# Patient Record
Sex: Female | Born: 1980 | Race: White | Hispanic: No | Marital: Married | Smoking: Former smoker
Health system: Southern US, Community
[De-identification: ages and names within clinical notes are randomized; demographics above are authoritative.]

## PROBLEM LIST (undated history)

## (undated) DIAGNOSIS — G43909 Migraine, unspecified, not intractable, without status migrainosus: Secondary | ICD-10-CM

## (undated) DIAGNOSIS — Z905 Acquired absence of kidney: Secondary | ICD-10-CM

## (undated) DIAGNOSIS — K219 Gastro-esophageal reflux disease without esophagitis: Secondary | ICD-10-CM

## (undated) DIAGNOSIS — E669 Obesity, unspecified: Secondary | ICD-10-CM

## (undated) DIAGNOSIS — N281 Cyst of kidney, acquired: Secondary | ICD-10-CM

## (undated) DIAGNOSIS — F909 Attention-deficit hyperactivity disorder, unspecified type: Secondary | ICD-10-CM

## (undated) DIAGNOSIS — R011 Cardiac murmur, unspecified: Secondary | ICD-10-CM

## (undated) HISTORY — PX: OTHER SURGICAL HISTORY: SHX169

## (undated) HISTORY — DX: Attention-deficit hyperactivity disorder, unspecified type: F90.9

## (undated) HISTORY — DX: Acquired absence of kidney: Z90.5

## (undated) HISTORY — DX: Obesity, unspecified: E66.9

## (undated) HISTORY — DX: Migraine, unspecified, not intractable, without status migrainosus: G43.909

## (undated) HISTORY — DX: Gastro-esophageal reflux disease without esophagitis: K21.9

## (undated) HISTORY — DX: Cyst of kidney, acquired: N28.1

## (undated) HISTORY — DX: Cardiac murmur, unspecified: R01.1

## (undated) HISTORY — PX: CHOLECYSTECTOMY: SHX55

## (undated) HISTORY — PX: TOTAL ABDOMINAL HYSTERECTOMY: SHX209

---

## 2002-10-29 ENCOUNTER — Emergency Department (HOSPITAL_COMMUNITY): Admission: EM | Admit: 2002-10-29 | Discharge: 2002-10-29 | Payer: Self-pay | Admitting: Emergency Medicine

## 2004-02-04 ENCOUNTER — Other Ambulatory Visit: Admission: RE | Admit: 2004-02-04 | Discharge: 2004-02-04 | Payer: Self-pay | Admitting: Obstetrics and Gynecology

## 2004-02-14 ENCOUNTER — Other Ambulatory Visit: Admission: RE | Admit: 2004-02-14 | Discharge: 2004-02-14 | Payer: Self-pay | Admitting: Obstetrics and Gynecology

## 2004-03-19 ENCOUNTER — Ambulatory Visit (HOSPITAL_COMMUNITY): Admission: RE | Admit: 2004-03-19 | Discharge: 2004-03-19 | Payer: Self-pay | Admitting: Obstetrics and Gynecology

## 2004-12-04 ENCOUNTER — Ambulatory Visit (HOSPITAL_COMMUNITY): Admission: RE | Admit: 2004-12-04 | Discharge: 2004-12-04 | Payer: Self-pay | Admitting: Pulmonary Disease

## 2005-04-28 ENCOUNTER — Other Ambulatory Visit: Admission: RE | Admit: 2005-04-28 | Discharge: 2005-04-28 | Payer: Self-pay | Admitting: Obstetrics and Gynecology

## 2005-08-26 ENCOUNTER — Encounter (HOSPITAL_COMMUNITY): Admission: RE | Admit: 2005-08-26 | Discharge: 2005-08-26 | Payer: Self-pay | Admitting: Internal Medicine

## 2006-02-18 IMAGING — NM NM HEPATO W/GB/PHARM/[PERSON_NAME]
2 series · 12 of 12 positions shown · non-contrast
Comparison: Ultrasound dated 12/04/04.

CLINICAL DATA: Abdominal pain. 
 NUCLEAR MEDICINE HEPATOBILIARY SCAN WITH EJECTION FRACTION:
TECHNIQUE: Sequential abdominal images were obtained following intravenous injection of radiopharmaceutical.  Sequential images were continued following oral ingestion of 8 oz. half-and-half, and the gallbladder ejection fraction was calculated.
 Radiopharmaceutical:  4 mCi Sc-XXm Choletec.

[Series 1: hepatobiliary · 3.20mm/px · 6 of 60 frames shown (1 of 2)]
[frame 6/60]
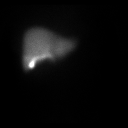
[frame 16/60]
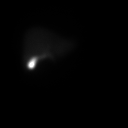
[frame 26/60]
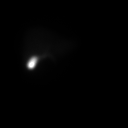
[frame 36/60]
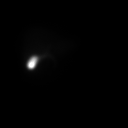
[frame 46/60]
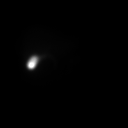
[frame 56/60]
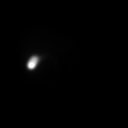

[Series 1: hepatobiliary · 3.20mm/px · 6 of 60 frames shown (2 of 2)]
[frame 6/60]
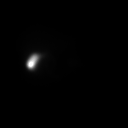
[frame 16/60]
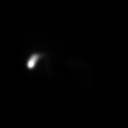
[frame 26/60]
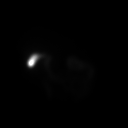
[frame 36/60]
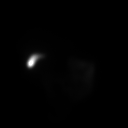
[frame 46/60]
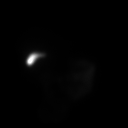
[frame 56/60]
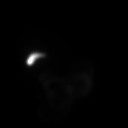

[12 of 12 positions shown; findings below may reference images not displayed]

FINDINGS: Following the intravenous administration of the radiopharmaceutical, there is prompt radiotracer uptake by the liver with excretion into the gallbladder.  After 60 minutes, the patient was given 8 oz of half-and-half.  Sequential images were obtained, 1 minute per frame.  Gallbladder ejection fraction is equal to 34%.
IMPRESSION: 1.  Patent cystic duct without evidence for acute cholecystitis. 
 2.  Gallbladder ejection fraction equals 34%.  When gallbladder stimulation is provided via oral half-and-half, 34% is considered abnormally low.

## 2006-02-24 ENCOUNTER — Ambulatory Visit (HOSPITAL_COMMUNITY): Admission: RE | Admit: 2006-02-24 | Discharge: 2006-02-24 | Payer: Self-pay | Admitting: Internal Medicine

## 2006-05-25 ENCOUNTER — Other Ambulatory Visit: Admission: RE | Admit: 2006-05-25 | Discharge: 2006-05-25 | Payer: Self-pay | Admitting: Obstetrics and Gynecology

## 2007-07-05 ENCOUNTER — Inpatient Hospital Stay (HOSPITAL_COMMUNITY): Admission: AD | Admit: 2007-07-05 | Discharge: 2007-07-05 | Payer: Self-pay | Admitting: Obstetrics and Gynecology

## 2007-07-23 ENCOUNTER — Inpatient Hospital Stay (HOSPITAL_COMMUNITY): Admission: AD | Admit: 2007-07-23 | Discharge: 2007-07-27 | Payer: Self-pay | Admitting: Obstetrics and Gynecology

## 2007-12-07 ENCOUNTER — Ambulatory Visit (HOSPITAL_COMMUNITY): Admission: RE | Admit: 2007-12-07 | Discharge: 2007-12-07 | Payer: Self-pay | Admitting: General Surgery

## 2007-12-07 ENCOUNTER — Encounter (INDEPENDENT_AMBULATORY_CARE_PROVIDER_SITE_OTHER): Payer: Self-pay | Admitting: General Surgery

## 2008-01-18 ENCOUNTER — Ambulatory Visit (HOSPITAL_COMMUNITY): Payer: Self-pay | Admitting: Endocrinology

## 2008-01-18 ENCOUNTER — Encounter (HOSPITAL_COMMUNITY): Admission: RE | Admit: 2008-01-18 | Discharge: 2008-02-17 | Payer: Self-pay | Admitting: Endocrinology

## 2009-05-20 ENCOUNTER — Encounter: Payer: Self-pay | Admitting: Orthopedic Surgery

## 2009-05-21 ENCOUNTER — Ambulatory Visit: Payer: Self-pay | Admitting: Orthopedic Surgery

## 2009-05-21 DIAGNOSIS — S139XXA Sprain of joints and ligaments of unspecified parts of neck, initial encounter: Secondary | ICD-10-CM | POA: Insufficient documentation

## 2009-05-21 DIAGNOSIS — M5412 Radiculopathy, cervical region: Secondary | ICD-10-CM | POA: Insufficient documentation

## 2009-05-21 DIAGNOSIS — G5600 Carpal tunnel syndrome, unspecified upper limb: Secondary | ICD-10-CM | POA: Insufficient documentation

## 2009-07-02 ENCOUNTER — Ambulatory Visit: Payer: Self-pay | Admitting: Orthopedic Surgery

## 2010-01-16 ENCOUNTER — Ambulatory Visit (HOSPITAL_COMMUNITY): Admission: RE | Admit: 2010-01-16 | Discharge: 2010-01-16 | Payer: Self-pay | Admitting: Internal Medicine

## 2010-07-11 IMAGING — US US RENAL
1 series · 14 of 14 positions shown · non-contrast
Comparison: 12/04/2004.

CLINICAL DATA: Hematuria.  Left nephrectomy.

RENAL/URINARY TRACT ULTRASOUND COMPLETE

[Series 1: us renal · 0.26mm/px · 14 of 14 slices shown]
[im 1/14]
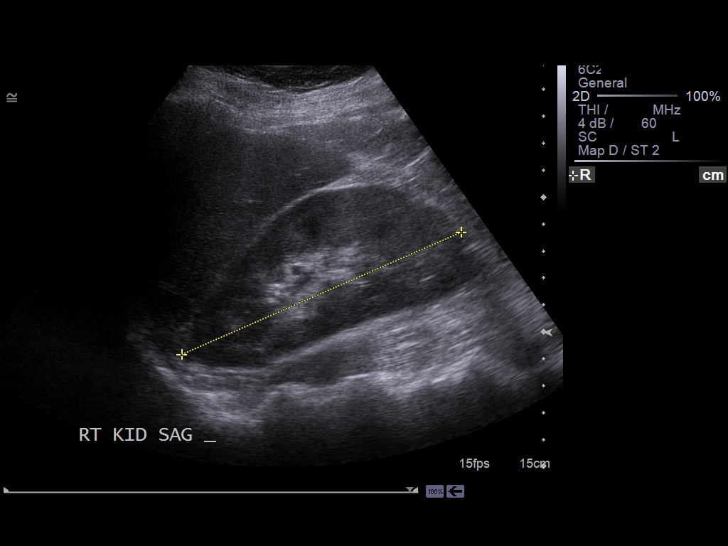
[im 2/14]
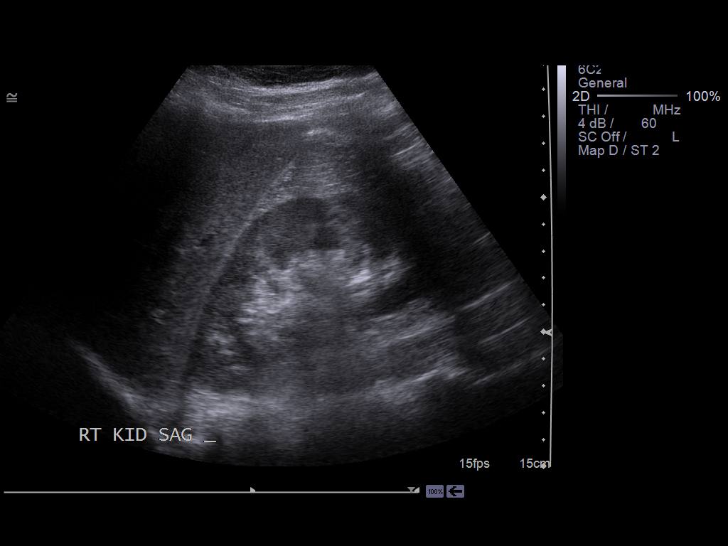
[im 3/14]
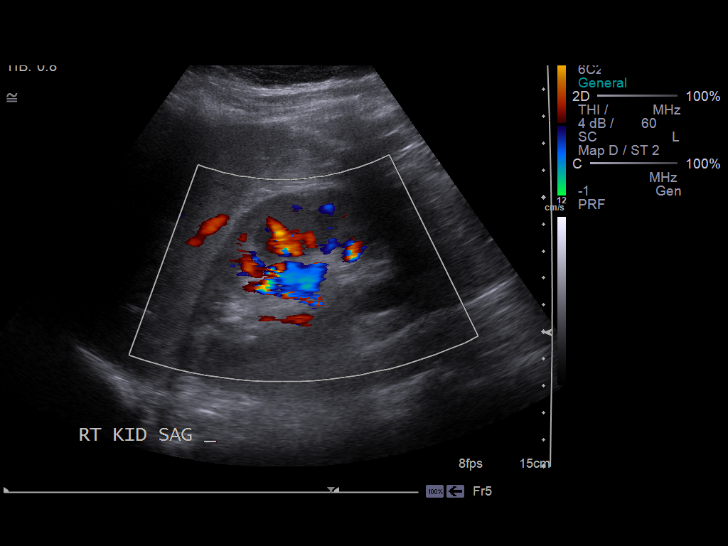
[im 4/14]
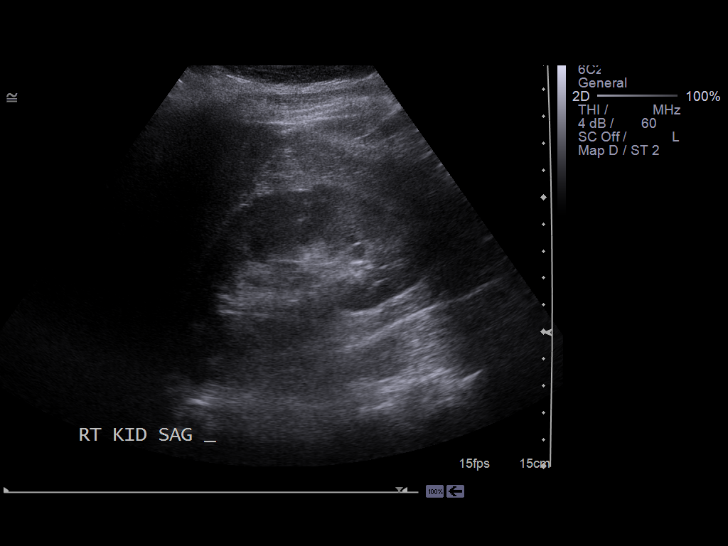
[im 5/14]
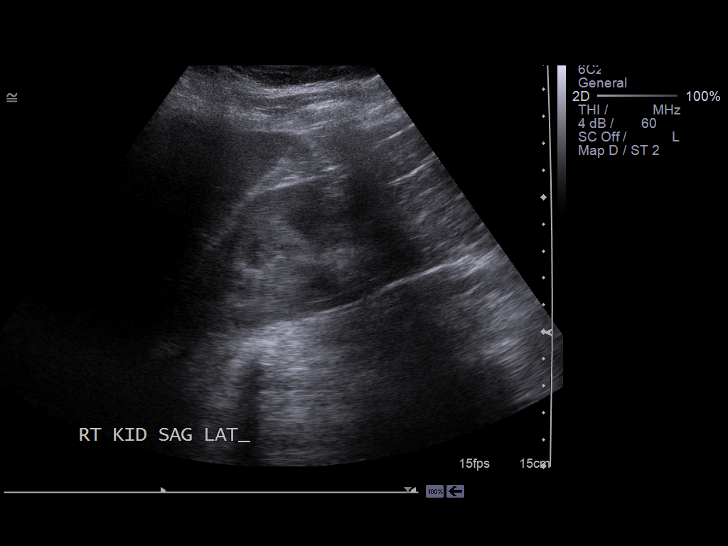
[im 6/14]
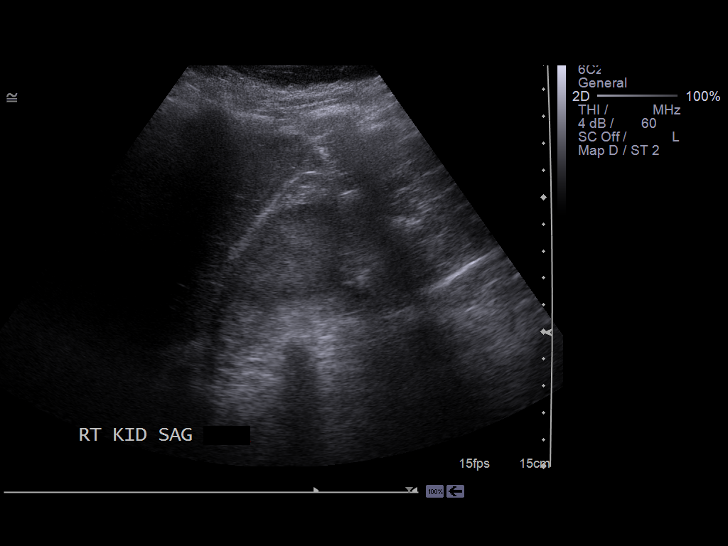
[im 7/14]
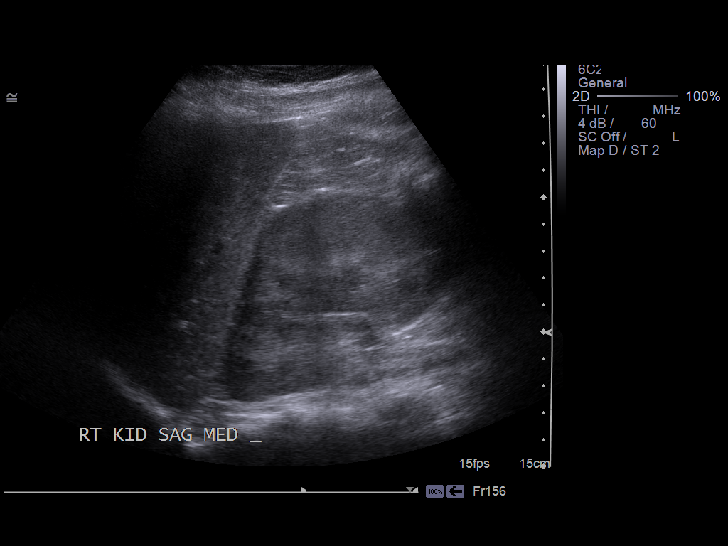
[im 8/14]
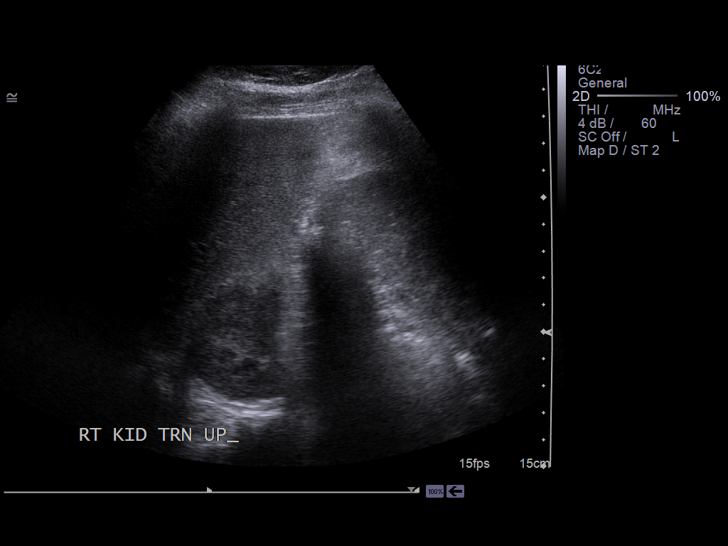
[im 9/14]
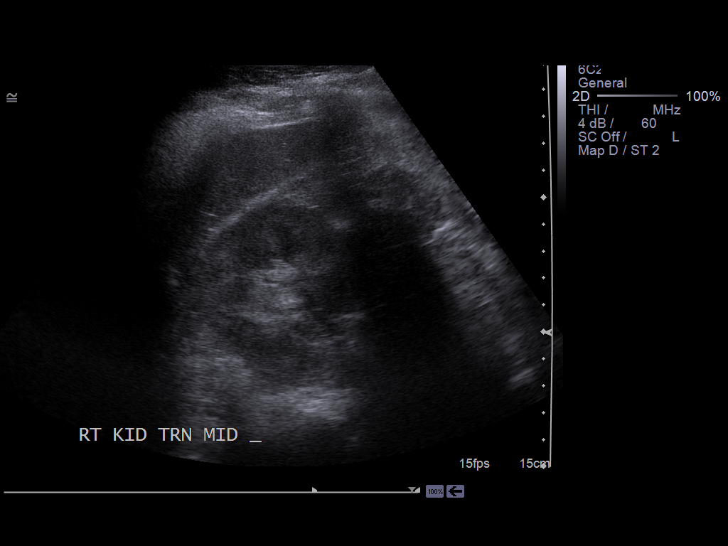
[im 10/14]
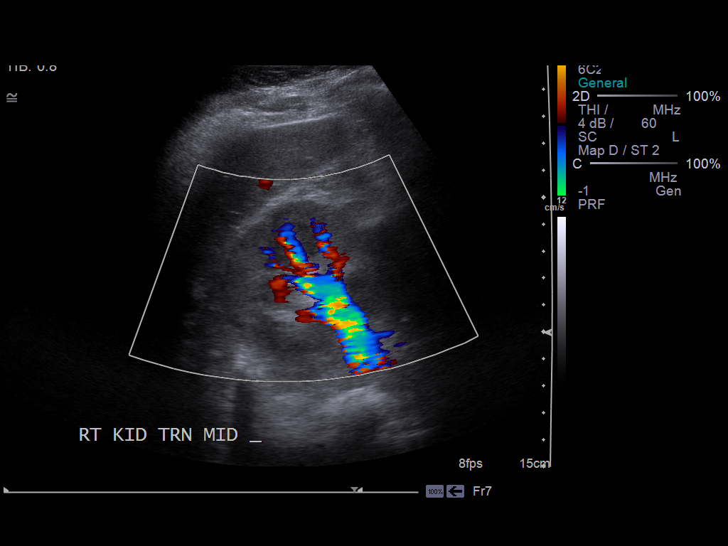
[im 11/14]
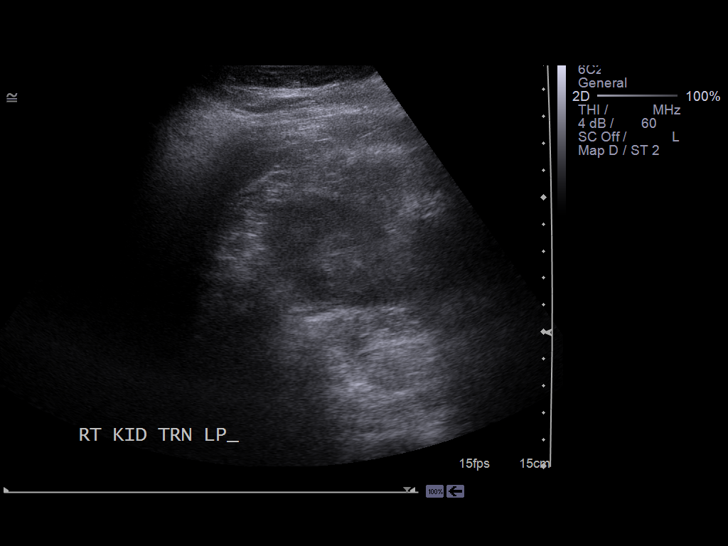
[im 12/14]
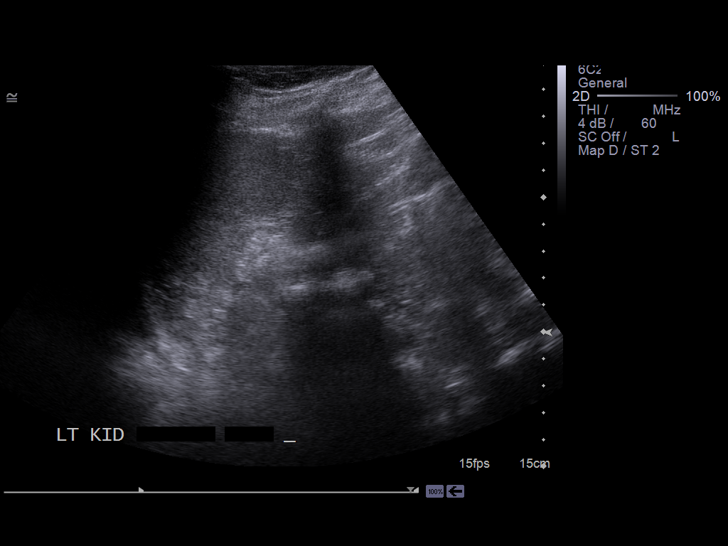
[im 13/14]
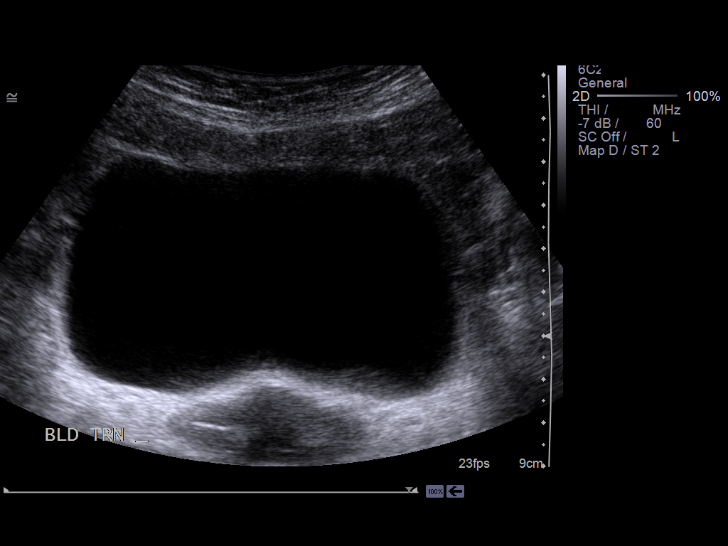
[im 14/14]
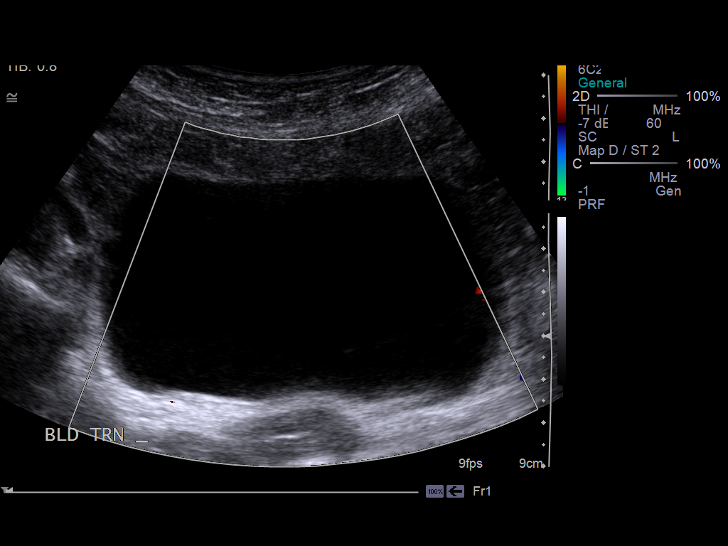

[14 of 14 positions shown; findings below may reference images not displayed]

FINDINGS: Right Kidney:  11.3 cm in length.  Normal renal cortex.  No
obstruction or mass.  No renal calculi.

Left Kidney:  Surgically absent due to kidney donation.

Bladder:  Normal
IMPRESSION: Normal right kidney and bladder.

## 2010-09-21 ENCOUNTER — Encounter: Payer: Self-pay | Admitting: Pulmonary Disease

## 2011-01-13 NOTE — H&P (Signed)
NAME:  Kaylee Contreras, Kaylee Contreras           ACCOUNT NO.:  [id number]   MEDICAL RECORD NO.:  [id number]          PATIENT TYPE:  AMB   LOCATION:  DAY                           FACILITY:  [facility]   PHYSICIAN:  Tilford Pillar, MD      DATE OF BIRTH:  April 25, 1981   DATE OF ADMISSION:  DATE OF DISCHARGE:  LH                              HISTORY & PHYSICAL   CHIEF COMPLAINT:  Infected nodule under left axilla.   HISTORY OF PRESENT ILLNESS:  The patient is a 30 year old female who  presented to my office after referral from her primary care physician  with multiple episodes of inflammation under her left axilla.  This  started soon after the birth of her child back in November with the  first episode in December 2008.  She has been treated multiple times  antibiotics mainly on Bactrim double-strength with response.  This is  followed by a return of the inflammation and erythema following  completion of her antibiotic course.  She has never had any drainage.  No fevers or chills.  No other nodules.  She has not had any problems in  the right side.  She has not noticed any breast changes, no breast  masses, no nipple discharge.  She has no family history of any  hidradenitis or recurrent infections.   PAST MEDICAL HISTORY:  None.   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  She is postoperative for a laparoscopic  cholecystectomy.   MEDICATIONS:  Currently on Bactrim double-strength and on Chantix.   ALLERGIES:  She is allergic IV DYES and develops hives.   SOCIAL HISTORY:  She smokes about one pack over a 4-day period.  Rarely  uses alcohol, no recreational drugs.  Occupation:  She works as a Psychologist, forensic.   PREGNANCIES:  One in November 2008.   REVIEW OF SYSTEMS:  CONSTITUTIONAL:  Unremarkable.  EYES:  Unremarkable.  EARS, NOSE AND THROAT:  Occasional rhinorrhea with seasonal allergies.  RESPIRATORY:  Unremarkable.  CARDIOVASCULAR:  Unremarkable.  GASTROINTESTINAL:  Occasional heartburn.  GENITOURINARY:  Unremarkable.  MUSCULOSKELETAL:  Does have some lower back pain.  SKIN:  Unremarkable  other than HPI.  ENDOCRINE: decreased energy.  NEURO:  Unremarkable.   PHYSICAL EXAMINATION:  The patient is a healthy, calm-appearing female  in no acute distress.  She is alert and oriented x3.  HEENT:  Scalp:  No deformities, no masses.  Eyes:  Pupils equal, round,  reactive.  Extraocular movements are intact.  No conjunctival pallor is  noted.  Ears:  No diminished hearing on evaluation.  Oral mucosa is  pink, normal occlusion.  NECK:  Trachea is midline.  No thyroid nodules or goiter is appreciated.  No cervical lymphadenopathy is apparent.  PULMONARY:  Unlabored respirations.  No wheezes, no crackles.  She is  clear to auscultation in bilateral lung fields.  CARDIOVASCULAR:  Regular rate and rhythm.  No murmurs, no gallops.  She has 2+ radial  pulses bilaterally.  No edema in the upper extremities.  CHEST WALL:  No deformities, no masses  or nodules are noted.  She does  have a firm area under the left axilla which is the area suspicion from  her last inflammatory response.  This is mobile, nontender,  nonerythematous, no drainage noted.  This, again, is in the left axilla  above the mid axillary line.  Again, nothing is appreciated on the right  side.  SKIN:  Warm and dry.   ASSESSMENT AND PLAN:  Left axillary cyst.  At this point my suspicion is  either a recurrent sebaceous cyst infection versus early development of  hidradenitis on this side, although I do not see any sinus tract.  This  was discussed with the patient.  At that this point both conservative as  well as operative interventions were discussed with the patient with  conservative treatment continuing with antibiotics and close follow-up  and observation.  As the patient has had several episodes of recurrent  infections with this, she does wish to have this are excised.   Risks,  benefits and alternatives of excision of this nodule were discussed with  the patient including the possibility of recurrence.  At this point I do  have a low suspicion of any cancerous process with no significant family  history or personal history consistent with breast cancer.  This would  be a very low suspicion at this point.  This was discussed with the  patient and at this point will plan to proceed with the operation and  will follow up in the postoperative period.      Tilford Pillar, MD     BZ/MEDQ  D:  12/01/2007  T:  12/02/2007  Job:  [id number]   cc:   Patrica Duel, M.D.  Fax: [phone]

## 2011-01-13 NOTE — Discharge Summary (Signed)
NAME:  Kaylee Contreras, Kaylee Contreras           ACCOUNT NO.:  [id number]   MEDICAL RECORD NO.:  [id number]          PATIENT TYPE:  INP   LOCATION:  9112                          FACILITY:  [facility]   PHYSICIAN:  Huel Cote, M.D. DATE OF BIRTH:  [birth year]   DATE OF ADMISSION:  07/23/2007  DATE OF DISCHARGE:  07/27/2007                               DISCHARGE SUMMARY   DISCHARGE DIAGNOSES:  1. Term pregnancy at 39-2/7 weeks, delivered.  2. Status post primary low transverse cesarean section for arrest of      dilation.   DISCHARGE MEDICATIONS:  1. Percocet 5/325 1-2 tablets p.o. every 4 hours p.r.n.  2. Diflucan 200 mg p.o. daily x7 days.   DISCHARGE FOLLOWUP:  Patient is to follow up in my office in two weeks  for an incision check.   HOSPITAL COURSE:  Patient is a 30 year old G1, P0, who is admitted at 62-  2/[redacted] weeks gestation with complaint of painful contractions every 5-10  minutes.  Cervix on admission was 84 at -1 station with bulging bag.  Prenatal care had been complicated by a history of depression.  She was  a smoker but had decreased to 1-2 cigarettes a day.  Prenatal labs were  as follows:  A+, antibody negative, RPR nonreactive, Rubella immune,  hepatitis C surface antigen negative, HIV negative, GC negative,  Chlamydia negative, Group B strep negative.  One hour Glucola 118.   PAST OB HISTORY:  None.   PAST GYN HISTORY:  None.   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  Her  other one had been donated to her father.  She had migraines and  depression in the past; however, was not currently having any issues  with this and was on no medications.   PAST SURGICAL HISTORY:  Cholecystectomy in 2007 and her nephrectomy  donation.   ALLERGIES:  She does not tolerate NONSTEROIDALS, which are not given due  to her status of a single kidney, and IV CONTRAST.   On admission, she was afebrile with stable vital signs.  Fetal heart  rates were reactive.   Contractions were every 5-8 minutes.  She was 84  and -1 station.  She received an epidural and had rupture of membranes  performed thereafter.  She was noted to be 95 and a -1 station.  She had  an IUPC placed to help with adjusting her Pitocin.  She labored  throughout the night, reached approximately 9 cm and remained 9 cm for  greater than three hours.  We tried multiple adjustments of Pitocin and  position change and really could not get the patient to complete  dilation.  The baby felt to be OP, and it was felt that she had  cephalopelvic disproportion.  We discussed the need to proceed with C-  section, to which she was agreeable.  She underwent a primary low  transverse C-section with double layer closure of the uterus and was  delivered of a viable female infant.  Apgars are 8 and 9.  Weight was 7  pounds, 12 ounces.  She had normal uterus, tubes,  and ovaries noted at  the time of section.  She did well post partum.   On postop day #1, her hemoglobin was 8.4, and this was down from 10.1.  She did have a temperature post partum to 101.4 and for this was given  several doses of Unasyn 3 gm IV every 6 hours to treat any possible  lingering endometritis.  She responded well to this and was afebrile  within 24 hours of delivery.   By postop day #2, she was doing well.  She did have a migraine, for  which she received Imitrex 50 mg x1 and that resolved.   On postop day #3, she was tolerating a regular diet.  All was well.  She  was felt stable for discharge home.  She had her staples removed, and  Steri-Strips placed.  She was placed on Percocet discharge medications  for pain since she is not able to take nonsteroidals.  She was given  Diflucan 200 mg p.o. 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      Huel Cote, M.D.  Electronically Signed     KR/MEDQ  D:  07/27/2007  T:  07/27/2007  Job:  [id number]

## 2011-01-13 NOTE — Op Note (Signed)
NAME:  Kaylee Contreras, Kaylee Contreras           ACCOUNT NO.:  [id number]   MEDICAL RECORD NO.:  [id number]          PATIENT TYPE:  AMB   LOCATION:  DAY                           FACILITY:  [facility]   PHYSICIAN:  Tilford Pillar, MD      DATE OF BIRTH:  March 24, 1981   DATE OF PROCEDURE:  12/07/2007  DATE OF DISCHARGE:                               OPERATIVE REPORT   PRIMARY CARE PHYSICIAN:  Patrica Duel, M.D.   PREOPERATIVE DIAGNOSIS:  Left axillary nodule.   POSTOPERATIVE DIAGNOSIS:  Left axillary nodule.   PROCEDURE:  Excision of left axillary nodule, approximately 2-cm  incision.   SURGEON:  Tilford Pillar, MD   ANESTHESIA:  General laryngeal mask airway.   BLOOD LOSS:  Minimal.   SPECIMEN:  Left axillary tissue.   INDICATIONS:  The patient is a 30 year old female who presented to my  office with a history of a nodule in the left axilla.  This had been  increased in size, became erythematous, and was exquisitely painful  until given the antibiotic treatment.  This had happened on multiple  occasions; and based on this discussion, I suspected this was likely a  small sebaceous cyst versus an early start hidradenitis.  The risks,  benefits, and alternatives of excision versus continued conservative  management were discussed with the patient.  Risks including but not  limited to infection, bleeding, and recurrence were discussed with the  patient.  The patient's questions and concerns were addressed.  The  patient was consented for the planned procedure.   OPERATION:  The patient was taken to the operating room.  She was placed  in the supine position on the operating table at which time general  anesthetic was administered.  Once the patient was asleep, she had a  laryngeal mask airway placed by anesthesia.  At this point, her left  axilla was prepped with Betadine solution.  Sterile drapes were placed  in a standard fashion.  A skin incision was created with a scalpel over  the palpable  nodule.  Additional dissection down through the  subcuticular tissue was carried out using electrocautery.  Allis clamp  was utilized to grasp the nodule and lift this anteriorly out of the  wound.  Dissection was continued with electrocautery until the mass was  freed circumferentially.  This was placed on the back table and sent as  permanent specimen to pathology.  At this point, the wound was palpated.  There were no additional nodules or masses or thickened tissue  identified on palpation.  During this dissection, a small Bovie burn was  created in the adjacent axillary skin.  This was excised with a scalpel  in an elliptical fashion and was reapproximated with a 4-0 Monocryl in a  subcuticular suture.  The wound was irrigated.  Local anesthetic was  instilled.  Hemostasis was excellent.  At this time, a 3-0 Vicryl was  utilized to reapproximate deep subcuticular tissue and 4-0 Monocryl was  utilized to reapproximate the skin edges in a running subcuticular  suture.  The skin was washed and dried with a moist and dry towel.  Benzoin was applied around the incision.  Half inch Steri-Strips were  placed.  Drapes were removed.  The patient was allowed to come out of  the general anesthetic and was transferred back to regular hospital bed.  She was transferred to the postanesthetic care unit in a stable  condition.  At the conclusion of the procedure, all instrument, sponge,  and needle counts were correct.  The patient tolerated the procedure  well.      Tilford Pillar, MD  Electronically Signed     BZ/MEDQ  D:  12/07/2007  T:  12/08/2007  Job:  [id number]   cc:   Patrica Duel, M.D.  Fax: [phone]

## 2011-01-13 NOTE — H&P (Signed)
NAME:  Kaylee Contreras, Kaylee Contreras           ACCOUNT NO.:  [id number]   MEDICAL RECORD NO.:  [id number]          PATIENT TYPE:  AMB   LOCATION:  DAY                           FACILITY:  [facility]   PHYSICIAN:  Tilford Pillar, MD      DATE OF BIRTH:  06-Nov-1980   DATE OF ADMISSION:  DATE OF DISCHARGE:  LH                              HISTORY & PHYSICAL   CHIEF COMPLAINT:  Infected nodule under left axilla.   HISTORY OF PRESENT ILLNESS:  The patient is a 30 year old female who  presented to my office after referral from her primary care physician  with multiple episodes of inflammation under her left axilla.  This  started soon after the birth of her child back in November with the  first episode in December 2008.  She has been treated multiple times  antibiotics mainly on Bactrim double-strength with response.  This is  followed by a return of the inflammation and erythema following  completion of her antibiotic course.  She has never had any drainage.  No fevers or chills.  No other nodules.  She has not had any problems in  the right side.  She has not noticed any breast changes, no breast  masses, no nipple discharge.  She has no family history of any  hidradenitis or recurrent infections.   PAST MEDICAL HISTORY:  None.   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  She is postoperative for a laparoscopic  cholecystectomy.   MEDICATIONS:  Currently on Bactrim double-strength and on Chantix.   ALLERGIES:  She is allergic IV DYES and develops hives.   SOCIAL HISTORY:  She smokes about one pack over a 4-day period.  Rarely  uses alcohol, no recreational drugs.  Occupation:  She works as a Psychologist, forensic.   PREGNANCIES:  One in November 2008.   REVIEW OF SYSTEMS:  CONSTITUTIONAL:  Unremarkable.  EYES:  Unremarkable.  EARS, NOSE AND THROAT:  Occasional rhinorrhea with seasonal allergies.  RESPIRATORY:  Unremarkable.  CARDIOVASCULAR:  Unremarkable.  GASTROINTESTINAL:  Occasional heartburn.  GENITOURINARY:  Unremarkable.  MUSCULOSKELETAL:  Does have some lower back pain.  SKIN:  Unremarkable  other than HPI.  ENDOCRINE: decreased energy.  NEURO:  Unremarkable.   PHYSICAL EXAMINATION:  The patient is a healthy, calm-appearing female  in no acute distress.  She is alert and oriented x3.  HEENT:  Scalp:  No deformities, no masses.  Eyes:  Pupils equal, round,  reactive.  Extraocular movements are intact.  No conjunctival pallor is  noted.  Ears:  No diminished hearing on evaluation.  Oral mucosa is  pink, normal occlusion.  NECK:  Trachea is midline.  No thyroid nodules or goiter is appreciated.  No cervical lymphadenopathy is apparent.  PULMONARY:  Unlabored respirations.  No wheezes, no crackles.  She is  clear to auscultation in bilateral lung fields.  CARDIOVASCULAR:  Regular rate and rhythm.  No murmurs, no gallops.  She has 2+ radial  pulses bilaterally.  No edema in the upper extremities.  CHEST WALL:  No deformities, no masses  or nodules are noted.  She does  have a firm area under the left axilla which is the area suspicion from  her last inflammatory response.  This is mobile, nontender,  nonerythematous, no drainage noted.  This, again, is in the left axilla  above the mid axillary line.  Again, nothing is appreciated on the right  side.  SKIN:  Warm and dry.   ASSESSMENT AND PLAN:  Left axillary cyst.  At this point my suspicion is  either a recurrent sebaceous cyst infection versus early development of  hidradenitis on this side, although I do not see any sinus tract.  This  was discussed with the patient.  At that this point both conservative as  well as operative interventions were discussed with the patient with  conservative treatment continuing with antibiotics and close follow-up  and observation.  As the patient has had several episodes of recurrent  infections with this, she does wish to have this are excised.   Risks,  benefits and alternatives of excision of this nodule were discussed with  the patient including the possibility of recurrence.  At this point I do  have a low suspicion of any cancerous process with no significant family  history or personal history consistent with breast cancer.  This would  be a very low suspicion at this point.  This was discussed with the  patient and at this point will plan to proceed with the operation and  will follow up in the postoperative period.      Tilford Pillar, MD  Electronically Signed     BZ/MEDQ  D:  12/01/2007  T:  12/02/2007  Job:  [id number]   cc:   Patrica Duel, M.D.  Fax: [phone]

## 2011-01-13 NOTE — Op Note (Signed)
NAME:  Kaylee Contreras, Kaylee Contreras           ACCOUNT NO.:  [id number]   MEDICAL RECORD NO.:  [id number]          PATIENT TYPE:  INP   LOCATION:  9112                          FACILITY:  [facility]   PHYSICIAN:  Huel Cote, M.D. DATE OF BIRTH:  September 27, 1980   DATE OF PROCEDURE:  07/24/2007  DATE OF DISCHARGE:                               OPERATIVE REPORT   PREOPERATIVE DIAGNOSES:  1. Arrest of cervical dilation at 9 cm.  2. Term pregnancy at 39+ weeks.   POSTOPERATIVE DIAGNOSES:  1. Arrest of cervical dilation at 9 cm.  2. Term pregnancy at 39+ weeks.   PROCEDURE:  Primary low transverse cesarean section with double-layer  closure of uterus.   SURGEON:  Huel Cote, MD   ANESTHESIA:  Epidural.   SPECIMEN:  Placenta; it was sent to Labor and Delivery after cord blood  collection.   ESTIMATED BLOOD LOSS:  800 mL.   URINE OUTPUT:  125 mL of clear urine.   INTRAVENOUS FLUIDS:  1300-mL LR.   COMPLICATIONS:  None, except for mild uterine atony.  There was a viable  female infant in vertex presentation that was delivered.  Apgars were 8  and 9.  Weight was 7 pounds 12 ounces.  Placenta delivered normally.  Uterus and tubes and ovaries were noted to be normal.   PROCEDURE:  The patient was taken to the operating room, where epidural  anesthesia was found to be adequate by Allis clamp test.  She was then  prepped and draped in the normal sterile fashion in the dorsal supine  position with a leftward tilt.  A Pfannenstiel's skin incision was then  made and carried through to the underlying layer of fascia by Bovie  cautery and sharp dissection.  The fascia was then nicked in the midline  and the incision was extended laterally.  The inferior aspect was  grasped with Kocher clamps, elevated and dissected off the underlying  rectus muscles.  The superior aspect was likewise dissected off the  rectus muscles.  These were separated in midline and the peritoneal  cavity entered bluntly.  The  peritoneal incision was then extended both  superiorly and inferiorly with careful attention to avoid both bowel and  bladder.  The Alexis wound retractor was then placed within the abdomen  and the lower uterine segment was incised nicely.  The uterine cavity  itself entered bluntly and the incision was extended bluntly.  The  infant's head was then delivered atraumatically and the nose and mouth  bulb-suctioned.  The remainder of the infant's body delivered and cord  was clamped and cut and handed off to the waiting pediatricians.  The  placenta was then expressed and handed off for cord blood donation.  The  uterus was cleared of all clots and debris with moist lap sponge.  There  was some uterine atony noted at this point which responded to Pitocin  and bimanual massage.  The uterine incision was then closed with 0  chromic in a running-locked fashion in 1 layer and the second layer an  imbricating layer of the same suture.  Good hemostasis was noted.  There  was no active bleeding noted.  All the abdominal and pelvic structures  appeared normal that were visible.  The gutters were cleared of all  clots and debris and the subfascial planes inspected and found to be  hemostatic.  The fascia was then closed with 0 Vicryl in a running  fashion and the skin was closed with staples.  Sponge, lap and needle  counts were correct x2 and the patient was taken to the recovery room in  stable condition.      Huel Cote, M.D.  Electronically Signed     KR/MEDQ  D:  07/24/2007  T:  07/25/2007  Job:  [id number]

## 2011-05-26 LAB — CBC
HCT: 38.2
Hemoglobin: 13.2
MCHC: 34.5
MCV: 82.3
Platelets: 304
RBC: 4.64
RDW: 15.4
WBC: 6.7

## 2011-05-26 LAB — BASIC METABOLIC PANEL
BUN: 13
CO2: 26
Calcium: 9.5
Chloride: 107
Creatinine, Ser: 1.14
GFR calc Af Amer: >60
GFR calc non Af Amer: 58 — ABNORMAL LOW
Glucose, Bld: 100 — ABNORMAL HIGH
Potassium: 3.6
Sodium: 140

## 2011-05-26 LAB — HCG, QUANTITATIVE, PREGNANCY: hCG, Beta Chain, Quant, S: 4

## 2011-05-27 LAB — CORTISOL
Cortisol, Plasma: 13.2
Cortisol, Plasma: 20.1
Cortisol, Plasma: 21.3

## 2011-06-09 LAB — CBC
HCT: 24 — ABNORMAL LOW
HCT: 29 — ABNORMAL LOW
Hemoglobin: 10.1 — ABNORMAL LOW
Hemoglobin: 8.4 — ABNORMAL LOW
MCHC: 34.9
MCHC: 34.9
MCV: 86.8
MCV: 86.9
Platelets: 210
Platelets: 258
RBC: 2.76 — ABNORMAL LOW
RBC: 3.34 — ABNORMAL LOW
RDW: 13.2
RDW: 13.8
WBC: 11.4 — ABNORMAL HIGH
WBC: 14.4 — ABNORMAL HIGH

## 2011-06-09 LAB — URINALYSIS, ROUTINE W REFLEX MICROSCOPIC
Bilirubin Urine: NEGATIVE
Glucose, UA: NEGATIVE
Hgb urine dipstick: NEGATIVE
Ketones, ur: NEGATIVE
Nitrite: NEGATIVE
Protein, ur: NEGATIVE
Specific Gravity, Urine: 1.01
Urobilinogen, UA: 0.2
pH: 6.5

## 2011-06-09 LAB — RPR: RPR Ser Ql: NONREACTIVE

## 2011-06-09 LAB — CCBB MATERNAL DONOR DRAW

## 2015-08-08 DIAGNOSIS — A630 Anogenital (venereal) warts: Secondary | ICD-10-CM | POA: Insufficient documentation

## 2016-03-19 DIAGNOSIS — Z6833 Body mass index (BMI) 33.0-33.9, adult: Secondary | ICD-10-CM | POA: Diagnosis not present

## 2016-03-19 DIAGNOSIS — F909 Attention-deficit hyperactivity disorder, unspecified type: Secondary | ICD-10-CM | POA: Diagnosis not present

## 2016-03-19 DIAGNOSIS — Z1389 Encounter for screening for other disorder: Secondary | ICD-10-CM | POA: Diagnosis not present

## 2016-07-22 DIAGNOSIS — Z23 Encounter for immunization: Secondary | ICD-10-CM | POA: Diagnosis not present

## 2016-08-05 DIAGNOSIS — E6609 Other obesity due to excess calories: Secondary | ICD-10-CM | POA: Diagnosis not present

## 2016-08-05 DIAGNOSIS — J0181 Other acute recurrent sinusitis: Secondary | ICD-10-CM | POA: Diagnosis not present

## 2016-08-05 DIAGNOSIS — F909 Attention-deficit hyperactivity disorder, unspecified type: Secondary | ICD-10-CM | POA: Diagnosis not present

## 2016-08-05 DIAGNOSIS — Z1389 Encounter for screening for other disorder: Secondary | ICD-10-CM | POA: Diagnosis not present

## 2016-08-05 DIAGNOSIS — J042 Acute laryngotracheitis: Secondary | ICD-10-CM | POA: Diagnosis not present

## 2016-08-05 DIAGNOSIS — Z6834 Body mass index (BMI) 34.0-34.9, adult: Secondary | ICD-10-CM | POA: Diagnosis not present

## 2016-12-23 DIAGNOSIS — Z6834 Body mass index (BMI) 34.0-34.9, adult: Secondary | ICD-10-CM | POA: Diagnosis not present

## 2016-12-23 DIAGNOSIS — Z1389 Encounter for screening for other disorder: Secondary | ICD-10-CM | POA: Diagnosis not present

## 2016-12-23 DIAGNOSIS — F909 Attention-deficit hyperactivity disorder, unspecified type: Secondary | ICD-10-CM | POA: Diagnosis not present

## 2016-12-23 DIAGNOSIS — E669 Obesity, unspecified: Secondary | ICD-10-CM | POA: Diagnosis not present

## 2016-12-23 DIAGNOSIS — E6609 Other obesity due to excess calories: Secondary | ICD-10-CM | POA: Diagnosis not present

## 2017-03-10 DIAGNOSIS — E669 Obesity, unspecified: Secondary | ICD-10-CM | POA: Diagnosis not present

## 2017-03-10 DIAGNOSIS — I341 Nonrheumatic mitral (valve) prolapse: Secondary | ICD-10-CM | POA: Diagnosis not present

## 2017-03-10 DIAGNOSIS — Z6834 Body mass index (BMI) 34.0-34.9, adult: Secondary | ICD-10-CM | POA: Diagnosis not present

## 2017-03-10 DIAGNOSIS — G43909 Migraine, unspecified, not intractable, without status migrainosus: Secondary | ICD-10-CM | POA: Diagnosis not present

## 2017-03-10 DIAGNOSIS — Z0001 Encounter for general adult medical examination with abnormal findings: Secondary | ICD-10-CM | POA: Diagnosis not present

## 2017-03-19 DIAGNOSIS — Z6834 Body mass index (BMI) 34.0-34.9, adult: Secondary | ICD-10-CM | POA: Diagnosis not present

## 2017-03-19 DIAGNOSIS — E6609 Other obesity due to excess calories: Secondary | ICD-10-CM | POA: Diagnosis not present

## 2017-06-17 DIAGNOSIS — Z6834 Body mass index (BMI) 34.0-34.9, adult: Secondary | ICD-10-CM | POA: Diagnosis not present

## 2017-06-17 DIAGNOSIS — M76892 Other specified enthesopathies of left lower limb, excluding foot: Secondary | ICD-10-CM | POA: Diagnosis not present

## 2017-06-17 DIAGNOSIS — M7062 Trochanteric bursitis, left hip: Secondary | ICD-10-CM | POA: Diagnosis not present

## 2017-06-17 DIAGNOSIS — Z23 Encounter for immunization: Secondary | ICD-10-CM | POA: Diagnosis not present

## 2017-06-17 DIAGNOSIS — Z1389 Encounter for screening for other disorder: Secondary | ICD-10-CM | POA: Diagnosis not present

## 2017-06-17 DIAGNOSIS — E6609 Other obesity due to excess calories: Secondary | ICD-10-CM | POA: Diagnosis not present

## 2017-09-06 DIAGNOSIS — F909 Attention-deficit hyperactivity disorder, unspecified type: Secondary | ICD-10-CM | POA: Diagnosis not present

## 2017-09-06 DIAGNOSIS — E6609 Other obesity due to excess calories: Secondary | ICD-10-CM | POA: Diagnosis not present

## 2017-09-06 DIAGNOSIS — J019 Acute sinusitis, unspecified: Secondary | ICD-10-CM | POA: Diagnosis not present

## 2017-09-06 DIAGNOSIS — Z6834 Body mass index (BMI) 34.0-34.9, adult: Secondary | ICD-10-CM | POA: Diagnosis not present

## 2017-09-06 DIAGNOSIS — Z1389 Encounter for screening for other disorder: Secondary | ICD-10-CM | POA: Diagnosis not present

## 2017-09-28 DIAGNOSIS — Z111 Encounter for screening for respiratory tuberculosis: Secondary | ICD-10-CM | POA: Diagnosis not present

## 2017-10-06 DIAGNOSIS — E6609 Other obesity due to excess calories: Secondary | ICD-10-CM | POA: Diagnosis not present

## 2017-10-06 DIAGNOSIS — E669 Obesity, unspecified: Secondary | ICD-10-CM | POA: Diagnosis not present

## 2017-10-06 DIAGNOSIS — F909 Attention-deficit hyperactivity disorder, unspecified type: Secondary | ICD-10-CM | POA: Diagnosis not present

## 2017-10-06 DIAGNOSIS — Z6834 Body mass index (BMI) 34.0-34.9, adult: Secondary | ICD-10-CM | POA: Diagnosis not present

## 2017-10-06 DIAGNOSIS — Z1389 Encounter for screening for other disorder: Secondary | ICD-10-CM | POA: Diagnosis not present

## 2018-06-03 DIAGNOSIS — Z23 Encounter for immunization: Secondary | ICD-10-CM | POA: Diagnosis not present

## 2018-06-21 DIAGNOSIS — K219 Gastro-esophageal reflux disease without esophagitis: Secondary | ICD-10-CM | POA: Diagnosis not present

## 2018-06-21 DIAGNOSIS — E669 Obesity, unspecified: Secondary | ICD-10-CM | POA: Diagnosis not present

## 2018-06-21 DIAGNOSIS — Z6834 Body mass index (BMI) 34.0-34.9, adult: Secondary | ICD-10-CM | POA: Diagnosis not present

## 2018-06-21 DIAGNOSIS — Z1389 Encounter for screening for other disorder: Secondary | ICD-10-CM | POA: Diagnosis not present

## 2018-06-21 DIAGNOSIS — G43909 Migraine, unspecified, not intractable, without status migrainosus: Secondary | ICD-10-CM | POA: Diagnosis not present

## 2018-06-21 DIAGNOSIS — F909 Attention-deficit hyperactivity disorder, unspecified type: Secondary | ICD-10-CM | POA: Diagnosis not present

## 2018-06-21 DIAGNOSIS — Z0001 Encounter for general adult medical examination with abnormal findings: Secondary | ICD-10-CM | POA: Diagnosis not present

## 2018-09-20 DIAGNOSIS — E6609 Other obesity due to excess calories: Secondary | ICD-10-CM | POA: Diagnosis not present

## 2018-09-20 DIAGNOSIS — Z6834 Body mass index (BMI) 34.0-34.9, adult: Secondary | ICD-10-CM | POA: Diagnosis not present

## 2018-09-20 DIAGNOSIS — Z1389 Encounter for screening for other disorder: Secondary | ICD-10-CM | POA: Diagnosis not present

## 2018-11-10 DIAGNOSIS — Z1389 Encounter for screening for other disorder: Secondary | ICD-10-CM | POA: Diagnosis not present

## 2018-11-10 DIAGNOSIS — K219 Gastro-esophageal reflux disease without esophagitis: Secondary | ICD-10-CM | POA: Diagnosis not present

## 2018-11-10 DIAGNOSIS — Z6835 Body mass index (BMI) 35.0-35.9, adult: Secondary | ICD-10-CM | POA: Diagnosis not present

## 2018-11-10 DIAGNOSIS — G43909 Migraine, unspecified, not intractable, without status migrainosus: Secondary | ICD-10-CM | POA: Diagnosis not present

## 2018-11-10 DIAGNOSIS — N183 Chronic kidney disease, stage 3 (moderate): Secondary | ICD-10-CM | POA: Diagnosis not present

## 2018-11-24 ENCOUNTER — Encounter: Payer: Self-pay | Admitting: Internal Medicine

## 2018-12-22 DIAGNOSIS — E669 Obesity, unspecified: Secondary | ICD-10-CM | POA: Diagnosis not present

## 2018-12-22 DIAGNOSIS — F909 Attention-deficit hyperactivity disorder, unspecified type: Secondary | ICD-10-CM | POA: Diagnosis not present

## 2018-12-22 DIAGNOSIS — G43909 Migraine, unspecified, not intractable, without status migrainosus: Secondary | ICD-10-CM | POA: Diagnosis not present

## 2018-12-22 DIAGNOSIS — K219 Gastro-esophageal reflux disease without esophagitis: Secondary | ICD-10-CM | POA: Diagnosis not present

## 2019-01-03 ENCOUNTER — Other Ambulatory Visit: Payer: Self-pay

## 2019-01-03 ENCOUNTER — Ambulatory Visit (INDEPENDENT_AMBULATORY_CARE_PROVIDER_SITE_OTHER): Payer: BLUE CROSS/BLUE SHIELD | Admitting: Gastroenterology

## 2019-01-03 ENCOUNTER — Telehealth: Payer: Self-pay | Admitting: *Deleted

## 2019-01-03 ENCOUNTER — Other Ambulatory Visit: Payer: Self-pay | Admitting: *Deleted

## 2019-01-03 ENCOUNTER — Encounter: Payer: Self-pay | Admitting: Gastroenterology

## 2019-01-03 DIAGNOSIS — K219 Gastro-esophageal reflux disease without esophagitis: Secondary | ICD-10-CM

## 2019-01-03 MED ORDER — SUCRALFATE 1 GM/10ML PO SUSP: ORAL | 0 refills | Status: DC

## 2019-01-03 MED ORDER — DEXLANSOPRAZOLE 60 MG PO CPDR: 60.0000 mg | DELAYED_RELEASE_CAPSULE | Freq: Every day | ORAL | 11 refills | Status: DC

## 2019-01-03 NOTE — Progress Notes (Signed)
Primary Care Physician:  Elfredia Nevins, MD Primary GI:  Roetta Sessions, MD   Patient Location: Home  Provider Location: [facility] office  Reason for Visit: GERD  Persons present on the virtual encounter, with roles: Patient, myself (provider), Carver Fila, CMA (updated meds and allergies)  Total time (minutes) spent on medical discussion: 25 minutes  Due to COVID-19, visit was conducted using [email or web address] method.  Visit was requested by patient.  Virtual Visit via [email or web address]  I connected with Illa Level on 01/03/19 at 11:30 AM EDT by [email or web address] and verified that I am speaking with the correct person using two identifiers.   I discussed the limitations, risks, security and privacy concerns of performing an evaluation and management service by telephone/video and the availability of in person appointments. I also discussed with the patient that there may be a patient responsible charge related to this service. The patient expressed understanding and agreed to proceed.  Chief Complaint  Patient presents with   Gastroesophageal Reflux    burning sensation, nausea, feeling like need to burp but never can get it to go away, vomiting in middle of night. Happens randomly     HPI:   Kaylee Contreras is a 38 y.o. female who presents for virtual visit regarding GERD at the request of Dr. Sherwood Gambler.   Used to have reflux around time of gb removed years ago. Then did ok for long time. For past year symptoms really worse. Nothing OTC works.  Has tried Tums, Rolaids, Nexium, Prevacid, Prilosec.  Started pantoprazole 40 mg daily several months ago.  Typical heartburn does okay during the day.  She continues to have feeling of pressure, urge to burp but can never get the symptoms to go away.  Has epigastric burning.  Frequent nausea.  Vomiting at nighttime after lays down.  Does not seem to matter what she eats.  Pantoprazole was increased to twice daily about a month ago but really has not made any  difference.  Sometimes at night she takes warm water with baking soda for relief.    No dysphagia.  Some mild constipation off and on.  No melena or rectal bleeding.  Appetite is good.  No weight loss.  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  She reports her most recent kidney labs were slightly elevated and it is being monitored.  She was advised to lay off on Excedrin use as it could be potential cause of decline in her renal function.  She had been taking Excedrin 3 to 5 days/week for migraines but since March 2020 has cut back significantly.  No FH colon cancer.   Current Outpatient Medications  Medication Sig Dispense Refill   amphetamine-dextroamphetamine (ADDERALL) 10 MG tablet Take 1 tablet by mouth as needed.     aspirin-acetaminophen-caffeine (EXCEDRIN MIGRAINE) 250-250-65 MG tablet Take 1 tablet by mouth every 6 (six) hours as needed for headache.     eletriptan (RELPAX) 20 MG tablet Take 1 tablet by mouth as needed.     dexlansoprazole (DEXILANT) 60 MG capsule Take 1 capsule (60 mg total) by mouth daily before breakfast. 30 capsule 11   sucralfate (CARAFATE) 1 GM/10ML suspension Take 10cc up to four times daily as needed for reflux symptoms. 420 mL 0   No current facility-administered medications for this visit.     Past Medical History:  Diagnosis Date   Migraines    Obesity    Single acquired cyst  of kidney    kidney donor to father for FSGS    Past Surgical History:  Procedure Laterality Date   CHOLECYSTECTOMY     kidney removed Left    donor for father who had FSGS    Family History  Problem Relation Age of Onset   Renal Disease Father        FSGS   Colon cancer Neg Hx     Social History   Socioeconomic History   Marital status: Single    Spouse name: Not on file   Number of children: Not on file   Years of education: Not on file   Highest education level: Not on file  Occupational History   Not  on file  Social Needs   Financial resource strain: Not on file   Food insecurity:    Worry: Not on file    Inability: Not on file   Transportation needs:    Medical: Not on file    Non-medical: Not on file  Tobacco Use   Smoking status: Former Smoker    Types: Cigarettes   Smokeless tobacco: Never Used  Substance and Sexual Activity   Alcohol use: Yes    Comment: socially   Drug use: Never   Sexual activity: Not on file  Lifestyle   Physical activity:    Days per week: Not on file    Minutes per session: Not on file   Stress: Not on file  Relationships   Social connections:    Talks on phone: Not on file    Gets together: Not on file    Attends religious service: Not on file    Active member of club or organization: Not on file    Attends meetings of clubs or organizations: Not on file    Relationship status: Not on file   Intimate partner violence:    Fear of current or ex partner: Not on file    Emotionally abused: Not on file    Physically abused: Not on file    Forced sexual activity: Not on file  Other Topics Concern   Not on file  Social History Narrative   Not on file      ROS:  General: Negative for anorexia, weight loss, fever, chills, fatigue, weakness. Eyes: Negative for vision changes.  ENT: Negative for hoarseness, difficulty swallowing , nasal congestion. CV: Negative for chest pain, angina, palpitations, dyspnea on exertion, peripheral edema.  Respiratory: Negative for dyspnea at rest, dyspnea on exertion, cough, sputum, wheezing.  GI: See history of present illness. GU:  Negative for dysuria, hematuria, urinary incontinence, urinary frequency, nocturnal urination.  MS: Negative for joint pain, low back pain.  Derm: Negative for rash or itching.  Neuro: Negative for weakness, abnormal sensation, seizure, frequent headaches, memory loss, confusion.  Psych: Negative for anxiety, depression, suicidal ideation, hallucinations.  Endo:  Negative for unusual weight change.  Heme: Negative for bruising or bleeding. Allergy: Negative for rash or hives.   Observations/Objective: Pleasant well-nourished well-developed Caucasian female in no acute distress.  Otherwise exam unavailable.  Assessment and Plan: Pleasant 38 year old female presenting for further evaluation of refractory GERD.  She has frequent epigastric burning associated with nausea.  Complains of waking up in the middle the night with vomiting.  No dysphagia.  She has had minimal improvement on pantoprazole 40 mg twice daily for the past month.  Had been on daily dosing for several months.  Does not seem to matter what she eats.  Continues to  have frequent symptoms.  Encouraged her to cut back on aspirin use as this may be exacerbating her GI symptoms.  We discussed options of upper endoscopy, she would like to pursue but would like to postpone until COVID-19 crisis improves.  We will schedule her for July and reassess closer to the time.  I have discussed the risks, alternatives, benefits with regards to but not limited to the risk of reaction to medication, bleeding, infection, perforation and the patient is agreeable to proceed. Written consent to be obtained.   In the meantime stop pantoprazole.  Begin Dexilant 60 mg daily before breakfast.  Add Carafate 1 g up to 4 times daily as needed for breakthrough symptoms.  Follow Up Instructions:    I discussed the assessment and treatment plan with the patient. The patient was provided an opportunity to ask questions and all were answered. The patient agreed with the plan and demonstrated an understanding of the instructions. AVS mailed to patient's home address.   The patient was advised to call back or seek an in-person evaluation if the symptoms worsen or if the condition fails to improve as anticipated.  I provided 25 minutes of virtual face-to-face time during this encounter.   Tana CoastLeslie Jeramie Scogin, PA-C

## 2019-01-03 NOTE — Telephone Encounter (Signed)
Called patient and EGD with RMR is scheduled for 7/8 at 8:30am. Patient aware will need to arrive at 7:30am. Confirmed address and will mail instructions.

## 2019-01-03 NOTE — Patient Instructions (Signed)
1. Stop pantoprazole.  Begin Dexilant 60 mg, 30 minutes before breakfast daily. 2. Carafate 10 cc up to 4 times daily as needed for breakthrough reflux type symptoms. 3. We will schedule you for an upper endoscopy in July. 4. Please call in the interim if you develop worsening symptoms.   Gastroesophageal Reflux Disease, Adult Gastroesophageal reflux (GER) happens when acid from the stomach flows up into the tube that connects the mouth and the stomach (esophagus). Normally, food travels down the esophagus and stays in the stomach to be digested. However, when a person has GER, food and stomach acid sometimes move back up into the esophagus. If this becomes a more serious problem, the person may be diagnosed with a disease called gastroesophageal reflux disease (GERD). GERD occurs when the reflux:  Happens often.  Causes frequent or severe symptoms.  Causes problems such as damage to the esophagus. When stomach acid comes in contact with the esophagus, the acid may cause soreness (inflammation) in the esophagus. Over time, GERD may create small holes (ulcers) in the lining of the esophagus. What are the causes? This condition is caused by a problem with the muscle between the esophagus and the stomach (lower esophageal sphincter, or LES). Normally, the LES muscle closes after food passes through the esophagus to the stomach. When the LES is weakened or abnormal, it does not close properly, and that allows food and stomach acid to go back up into the esophagus. The LES can be weakened by certain dietary substances, medicines, and medical conditions, including:  Tobacco use.  Pregnancy.  Having a hiatal hernia.  Alcohol use.  Certain foods and beverages, such as coffee, chocolate, onions, and peppermint. What increases the risk? You are more likely to develop this condition if you:  Have an increased body weight.  Have a connective tissue disorder.  Use NSAID medicines. What are the  signs or symptoms? Symptoms of this condition include:  Heartburn.  Difficult or painful swallowing.  The feeling of having a lump in the throat.  Abitter taste in the mouth.  Bad breath.  Having a large amount of saliva.  Having an upset or bloated stomach.  Belching.  Chest pain. Different conditions can cause chest pain. Make sure you see your health care provider if you experience chest pain.  Shortness of breath or wheezing.  Ongoing (chronic) cough or a night-time cough.  Wearing away of tooth enamel.  Weight loss. How is this diagnosed? Your health care provider will take a medical history and perform a physical exam. To determine if you have mild or severe GERD, your health care provider may also monitor how you respond to treatment. You may also have tests, including:  A test to examine your stomach and esophagus with a small camera (endoscopy).  A test thatmeasures the acidity level in your esophagus.  A test thatmeasures how much pressure is on your esophagus.  A barium swallow or modified barium swallow test to show the shape, size, and functioning of your esophagus. How is this treated? The goal of treatment is to help relieve your symptoms and to prevent complications. Treatment for this condition may vary depending on how severe your symptoms are. Your health care provider may recommend:  Changes to your diet.  Medicine.  Surgery. Follow these instructions at home: Eating and drinking   Follow a diet as recommended by your health care provider. This may involve avoiding foods and drinks such as: ? Coffee and tea (with or without caffeine). ?  Drinks that containalcohol. ? Energy drinks and sports drinks. ? Carbonated drinks or sodas. ? Chocolate and cocoa. ? Peppermint and mint flavorings. ? Garlic and onions. ? Horseradish. ? Spicy and acidic foods, including peppers, chili powder, curry powder, vinegar, hot sauces, and barbecue sauce. ?  Citrus fruit juices and citrus fruits, such as oranges, lemons, and limes. ? Tomato-based foods, such as red sauce, chili, salsa, and pizza with red sauce. ? Fried and fatty foods, such as donuts, french fries, potato chips, and high-fat dressings. ? High-fat meats, such as hot dogs and fatty cuts of red and white meats, such as rib eye steak, sausage, ham, and bacon. ? High-fat dairy items, such as whole milk, butter, and cream cheese.  Eat small, frequent meals instead of large meals.  Avoid drinking large amounts of liquid with your meals.  Avoid eating meals during the 2-3 hours before bedtime.  Avoid lying down right after you eat.  Do not exercise right after you eat. Lifestyle   Do not use any products that contain nicotine or tobacco, such as cigarettes, e-cigarettes, and chewing tobacco. If you need help quitting, ask your health care provider.  Try to reduce your stress by using methods such as yoga or meditation. If you need help reducing stress, ask your health care provider.  If you are overweight, reduce your weight to an amount that is healthy for you. Ask your health care provider for guidance about a safe weight loss goal. General instructions  Pay attention to any changes in your symptoms.  Take over-the-counter and prescription medicines only as told by your health care provider. Do not take aspirin, ibuprofen, or other NSAIDs unless your health care provider told you to do so.  Wear loose-fitting clothing. Do not wear anything tight around your waist that causes pressure on your abdomen.  Raise (elevate) the head of your bed about 6 inches (15 cm).  Avoid bending over if this makes your symptoms worse.  Keep all follow-up visits as told by your health care provider. This is important. Contact a health care provider if:  You have: ? New symptoms. ? Unexplained weight loss. ? Difficulty swallowing or it hurts to swallow. ? Wheezing or a persistent cough. ?  A hoarse voice.  Your symptoms do not improve with treatment. Get help right away if you:  Have pain in your arms, neck, jaw, teeth, or back.  Feel sweaty, dizzy, or light-headed.  Have chest pain or shortness of breath.  Vomit and your vomit looks like blood or coffee grounds.  Faint.  Have stool that is bloody or black.  Cannot swallow, drink, or eat. Summary  Gastroesophageal reflux happens when acid from the stomach flows up into the esophagus. GERD is a disease in which the reflux happens often, causes frequent or severe symptoms, or causes problems such as damage to the esophagus.  Treatment for this condition may vary depending on how severe your symptoms are. Your health care provider may recommend diet and lifestyle changes, medicine, or surgery.  Contact a health care provider if you have new or worsening symptoms.  Take over-the-counter and prescription medicines only as told by your health care provider. Do not take aspirin, ibuprofen, or other NSAIDs unless your health care provider told you to do so.  Keep all follow-up visits as told by your health care provider. This is important. This information is not intended to replace advice given to you by your health care provider. Make sure you discuss  any questions you have with your health care provider. Document Released: 05/27/2005 Document Revised: 02/23/2018 Document Reviewed: 02/23/2018 Elsevier Interactive Patient Education  2019 [employer]  Food Choices for Gastroesophageal Reflux Disease, Adult When you have gastroesophageal reflux disease (GERD), the foods you eat and your eating habits are very important. Choosing the right foods can help ease the discomfort of GERD. Consider working with a diet and nutrition specialist (dietitian) to help you make healthy food choices. What general guidelines should I follow?  Eating plan  Choose healthy foods low in fat, such as fruits, vegetables, whole grains,  low-fat dairy products, and lean meat, fish, and poultry.  Eat frequent, small meals instead of three large meals each day. Eat your meals slowly, in a relaxed setting. Avoid bending over or lying down until 2-3 hours after eating.  Limit high-fat foods such as fatty meats or fried foods.  Limit your intake of oils, butter, and shortening to less than 8 teaspoons each day.  Avoid the following: ? Foods that cause symptoms. These may be different for different people. Keep a food diary to keep track of foods that cause symptoms. ? Alcohol. ? Drinking large amounts of liquid with meals. ? Eating meals during the 2-3 hours before bed.  Cook foods using methods other than frying. This may include baking, grilling, or broiling. Lifestyle  Maintain a healthy weight. Ask your health care provider what weight is healthy for you. If you need to lose weight, work with your health care provider to do so safely.  Exercise for at least 30 minutes on 5 or more days each week, or as told by your health care provider.  Avoid wearing clothes that fit tightly around your waist and chest.  Do not use any products that contain nicotine or tobacco, such as cigarettes and e-cigarettes. If you need help quitting, ask your health care provider.  Sleep with the head of your bed raised. Use a wedge under the mattress or blocks under the bed frame to raise the head of the bed. What foods are not recommended? The items listed may not be a complete list. Talk with your dietitian about what dietary choices are best for you. Grains Pastries or quick breads with added fat. [place] toast. Vegetables Deep fried vegetables. [place] fries. Any vegetables prepared with added fat. Any vegetables that cause symptoms. For some people this may include tomatoes and tomato products, chili peppers, onions and garlic, and horseradish. Fruits Any fruits prepared with added fat. Any fruits that cause symptoms. For some people this  may include citrus fruits, such as oranges, grapefruit, pineapple, and lemons. Meats and other protein foods High-fat meats, such as fatty beef or pork, hot dogs, ribs, ham, sausage, salami and bacon. Fried meat or protein, including fried fish and fried chicken. Nuts and nut butters. Dairy Whole milk and chocolate milk. Sour cream. Cream. Ice cream. Cream cheese. Milk shakes. Beverages Coffee and tea, with or without caffeine. Carbonated beverages. Sodas. Energy drinks. Fruit juice made with acidic fruits (such as orange or grapefruit). Tomato juice. Alcoholic drinks. Fats and oils Butter. Margarine. Shortening. Ghee. Sweets and desserts Chocolate and cocoa. Donuts. Seasoning and other foods Pepper. Peppermint and spearmint. Any condiments, herbs, or seasonings that cause symptoms. For some people, this may include curry, hot sauce, or vinegar-based salad dressings. Summary  When you have gastroesophageal reflux disease (GERD), food and lifestyle choices are very important to help ease the discomfort of GERD.  Eat frequent, small meals instead  of three large meals each day. Eat your meals slowly, in a relaxed setting. Avoid bending over or lying down until 2-3 hours after eating.  Limit high-fat foods such as fatty meat or fried foods. This information is not intended to replace advice given to you by your health care provider. Make sure you discuss any questions you have with your health care provider. Document Released: 08/17/2005 Document Revised: 08/18/2016 Document Reviewed: 08/18/2016 Elsevier Interactive Patient Education  2019 [employer].

## 2019-01-04 ENCOUNTER — Encounter: Payer: Self-pay | Admitting: *Deleted

## 2019-01-04 ENCOUNTER — Telehealth: Payer: Self-pay | Admitting: *Deleted

## 2019-01-04 NOTE — Progress Notes (Signed)
cc'ed to pcp °

## 2019-01-04 NOTE — Telephone Encounter (Signed)
Received call back from patient, updated EMR.

## 2019-01-04 NOTE — Telephone Encounter (Signed)
LVM requesting call back to update EMR.  

## 2019-01-05 ENCOUNTER — Other Ambulatory Visit: Payer: Self-pay

## 2019-01-05 ENCOUNTER — Ambulatory Visit (INDEPENDENT_AMBULATORY_CARE_PROVIDER_SITE_OTHER): Payer: BLUE CROSS/BLUE SHIELD | Admitting: Diagnostic Neuroimaging

## 2019-01-05 DIAGNOSIS — G43109 Migraine with aura, not intractable, without status migrainosus: Secondary | ICD-10-CM

## 2019-01-05 MED ORDER — FREMANEZUMAB-VFRM 225 MG/1.5ML ~~LOC~~ SOSY: 225.0000 mg | PREFILLED_SYRINGE | SUBCUTANEOUS | 4 refills | Status: DC

## 2019-01-05 NOTE — Progress Notes (Signed)
[facility]  PATIENT: Kaylee Contreras DOB: [birth year]  REFERRING CLINICIAN: L Fusco HISTORY FROM: patient  REASON FOR VISIT: new consult    HISTORICAL  CHIEF COMPLAINT:  Chief Complaint  Patient presents with  . Headache    HISTORY OF PRESENT ILLNESS:   38 year old female here for evaluation of migraine headaches.  Patient has had headaches since age 43 years old, right greater than left-sided, throbbing and sharp headaches with photophobia and nausea.  Sometimes has sinus pressure.  Headaches gradually increased over time.  Nowadays having 4-5 headaches per week, with one severe migraine per month.  The severe headache usually occurs in the first 1 to 2 days of her menstrual cycle.  Patient is tried Excedrin Migraine, Relpax and Topamax without relief.  Topamax caused significant side effects.  Other triggering factors include certain perfumes and smells, weather changes.  Patient sometimes sees black dots at the onset of headache.    REVIEW OF SYSTEMS: Full 14 system review of systems performed and negative with exception of: As per HPI.  ALLERGIES: Allergies  Allergen Reactions  . Iodinated Diagnostic Agents     "only has 1 kidney"  . Nsaids Other (See Comments)    Only one kidney  . Prednisone Other (See Comments)    HOME MEDICATIONS: Outpatient Medications Prior to Visit  Medication Sig Dispense Refill  . amphetamine-dextroamphetamine (ADDERALL) 10 MG tablet Take 1 tablet by mouth as needed.    Marland Kitchen aspirin-acetaminophen-caffeine (EXCEDRIN MIGRAINE) 250-250-65 MG tablet Take 1 tablet by mouth every 6 (six) hours as needed for headache.    . dexlansoprazole (DEXILANT) 60 MG capsule Take 1 capsule (60 mg total) by mouth daily before breakfast. (Patient not taking: Reported on 01/04/2019) 30 capsule 11  . eletriptan (RELPAX) 20 MG tablet Take 1 tablet by mouth as needed.    . phentermine 37.5 MG capsule phentermine 37.5 mg capsule  Take 1 capsule every  day by oral route.    . sucralfate (CARAFATE) 1 GM/10ML suspension Take 10cc up to four times daily as needed for reflux symptoms. (Patient not taking: Reported on 01/04/2019) 420 mL 0   No facility-administered medications prior to visit.     PAST MEDICAL HISTORY: Past Medical History:  Diagnosis Date  . ADHD   . GERD (gastroesophageal reflux disease)   . Migraines   . Obesity   . 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 CESAREAN SECTION     x 1  . CHOLECYSTECTOMY    . kidney removed Left    donor for father who had FSGS    FAMILY HISTORY: Family History  Problem Relation Age of Onset  . Renal Disease Father        FSGS  . High Cholesterol Mother   . Dementia Maternal Grandmother   . Lung disease Paternal Grandfather   . Colon cancer Neg Hx     SOCIAL HISTORY: Social History   Socioeconomic History  . Marital status: Married    Spouse name: Not on file  . Number of children: 1  . Years of education: Not on file  . Highest education level: Bachelor's degree (e.g., BA, AB, BS)  Occupational History    Comment: insurance agent  Social Needs  . Financial resource strain: Not on file  . Food insecurity:    Worry: Not on file    Inability:  Not on file  . Transportation needs:    Medical: Not on file    Non-medical: Not on file  Tobacco Use  . Smoking status: Former Smoker    Types: Cigarettes    Last attempt to quit: 01/03/2009    Years since quitting: 10.0  . Smokeless tobacco: Never Used  Substance and Sexual Activity  . Alcohol use: Yes    Comment: socially  . Drug use: Never  . Sexual activity: Not on file  Lifestyle  . Physical activity:    Days per week: Not on file    Minutes per session: Not on file  . Stress: Not on file  Relationships  . Social connections:    Talks on phone: Not on file    Gets together: Not on file    Attends religious  service: Not on file    Active member of club or organization: Not on file    Attends meetings of clubs or organizations: Not on file    Relationship status: Not on file  . Intimate partner violence:    Fear of current or ex partner: Not on file    Emotionally abused: Not on file    Physically abused: Not on file    Forced sexual activity: Not on file  Other Topics Concern  . Not on file  Social History Narrative   Lives with family   Caffeine- coffee 1 cup daily, may 1 glass tea     PHYSICAL EXAM   VIDEO EXAM  GENERAL EXAM/CONSTITUTIONAL:  Vitals: There were no vitals filed for this visit.  There is no height or weight on file to calculate BMI. Wt Readings from Last 3 Encounters:  No data found for Wt     Patient is in no distress; well developed, nourished and groomed; neck is supple   NEUROLOGIC: MENTAL STATUS:  No flowsheet data found.  awake, alert, oriented to person, place and time  recent and remote memory intact  normal attention and concentration  language fluent, comprehension intact, naming intact  fund of knowledge appropriate  CRANIAL NERVE:   2nd, 3rd, 4th, 6th - visual fields full to confrontation, extraocular muscles intact, no nystagmus  5th - facial sensation symmetric  7th - facial strength symmetric  8th - hearing intact  11th - shoulder shrug symmetric  12th - tongue protrusion midline  MOTOR:   NO TREMOR; NO DRIFT IN BUE  SENSORY:   normal and symmetric to light touch  COORDINATION:   fine finger movements normal     DIAGNOSTIC DATA (LABS, IMAGING, TESTING) - I reviewed patient records, labs, notes, testing and imaging myself where available.  Lab Results  Component Value Date   WBC 6.7 12/05/2007   HGB 13.2 12/05/2007   HCT 38.2 12/05/2007   MCV 82.3 12/05/2007   PLT 304 12/05/2007      Component Value Date/Time   NA 140 12/05/2007 1345   K 3.6 12/05/2007 1345   CL 107 12/05/2007 1345   CO2 26  12/05/2007 1345   GLUCOSE 100 (H) 12/05/2007 1345   BUN 13 12/05/2007 1345   CREATININE 1.14 12/05/2007 1345   CALCIUM 9.5 12/05/2007 1345   GFRNONAA 58 (L) 12/05/2007 1345   GFRAA  12/05/2007 1345    >60        The eGFR has been calculated using the MDRD equation. This calculation has not been validated in all clinical   No results found for: CHOL, HDL, LDLCALC, LDLDIRECT, TRIG, CHOLHDL No results  found for: HGBA1C No results found for: VITAMINB12 No results found for: TSH     ASSESSMENT AND PLAN  38 y.o. year old female here with migraine with aura.    Meds tried and failed: topiramate (side effect), excedrin, relpax, maxalt, effexor, tylenol  Cannot take: depakote (due to weight gain)   Dx:  1. Migraine with aura and without status migrainosus, not intractable      PLAN:  - start ajovy injections; migraine prevention  - continue relpax; migraine rescue  Meds ordered this encounter  Medications  . Fremanezumab-vfrm (AJOVY) 225 MG/1.5ML SOSY    Sig: Inject 225 mg into the skin every 30 (thirty) days.    Dispense:  3 Syringe    Refill:  4    Return in about 4 months (around 05/08/2019) for with NP (Amy Lomax).    Penni Bombard, MD 10/10/5619, 3:08 PM Certified in Neurology, Neurophysiology and Neuroimaging  [facility] [address] [place], [place] [address] [phone]

## 2019-01-10 ENCOUNTER — Telehealth: Payer: Self-pay | Admitting: *Deleted

## 2019-01-10 NOTE — Telephone Encounter (Signed)
Gwynneth Albright PA on [facility], key: [id number] . PA Case ID: [id number], Dx : G43.109, Meds tried and failed: topiramate (side effect), excedrin, relpax, maxalt, effexor, tylenol, cannot take: depakote (due to weight gain). Clinical information sent to [facility].

## 2019-01-11 MED ORDER — GALCANEZUMAB-GNLM 120 MG/ML ~~LOC~~ SOAJ: 120.0000 mg | SUBCUTANEOUS | 4 refills | Status: DC

## 2019-01-11 NOTE — Telephone Encounter (Addendum)
Spoke with patient and informed her that insurance wouldn't approve Ajovy; she had to try Aimovig or Emgaility. I advised Dr Marjory Lies prescribed Greenleaf, but it will require PA as well. However should be fast approval since it is on insurance formulary. I advised she check pharmacy by tomorrow; they may need to order it. She  verbalized understanding, appreciation. Started [employer] PA on CMM, [id number] , answered clinical questions. Information sent to [facility].

## 2019-01-11 NOTE — Addendum Note (Signed)
Addended by: Joycelyn Schmid R on: 01/11/2019 12:55 PM   Modules accepted: Orders

## 2019-01-11 NOTE — Telephone Encounter (Addendum)
Received fax from [facility]: denied, not on formulary, must have tried or cannot use 1 from following:  Aimovig, Emgality or propranolol.  Will discuss an alternative approved med with Dr Marjory Lies.

## 2019-01-11 NOTE — Telephone Encounter (Signed)
Changing to emgality. -VRP

## 2019-01-12 NOTE — Telephone Encounter (Signed)
Received fax from [employer], Emgality approved through 08/31/2019, [id number]. Called pharmacy, spoke with Tripp,  Pharmacist and advised him Emgality requires a loading dose of 2 pens = 240 mg dose. Her prescription allows 1 pen every 30 days x 12 months.  He ran verbal Rx of two pens for loading dose through her insurance, and it was approved. He stated he would have to order it; he will call patient and instruct her on administration if needed. He  verbalized understanding, appreciation of call.

## 2019-02-27 ENCOUNTER — Telehealth: Payer: Self-pay | Admitting: *Deleted

## 2019-02-27 ENCOUNTER — Ambulatory Visit: Payer: Self-pay | Admitting: Nurse Practitioner

## 2019-02-27 NOTE — Telephone Encounter (Addendum)
Pt is scheduled for a COVID 19 screening rapid test on 03/07/2019.  Pt is aware to remain in quarantine once testing is done.  Pt voiced understanding.   

## 2019-03-07 ENCOUNTER — Other Ambulatory Visit: Payer: Self-pay

## 2019-03-07 ENCOUNTER — Other Ambulatory Visit (HOSPITAL_COMMUNITY)
Admission: RE | Admit: 2019-03-07 | Discharge: 2019-03-07 | Disposition: A | Discharge status: Home / Self Care | Payer: BC Managed Care – PPO | Source: Ambulatory Visit | Attending: Internal Medicine | Admitting: Internal Medicine

## 2019-03-07 DIAGNOSIS — Z1159 Encounter for screening for other viral diseases: Secondary | ICD-10-CM | POA: Diagnosis not present

## 2019-03-07 DIAGNOSIS — Z01812 Encounter for preprocedural laboratory examination: Secondary | ICD-10-CM | POA: Diagnosis not present

## 2019-03-08 ENCOUNTER — Encounter (HOSPITAL_COMMUNITY): Payer: Self-pay | Admitting: *Deleted

## 2019-03-08 ENCOUNTER — Encounter (HOSPITAL_COMMUNITY)
Admission: RE | Disposition: A | Discharge status: Home / Self Care | Payer: Self-pay | Source: Home / Self Care | Attending: Internal Medicine

## 2019-03-08 ENCOUNTER — Other Ambulatory Visit: Payer: Self-pay

## 2019-03-08 ENCOUNTER — Ambulatory Visit (HOSPITAL_COMMUNITY)
Admission: RE | Admit: 2019-03-08 | Discharge: 2019-03-08 | Disposition: A | Discharge status: Home / Self Care | Payer: BC Managed Care – PPO | Attending: Internal Medicine | Admitting: Internal Medicine

## 2019-03-08 DIAGNOSIS — K219 Gastro-esophageal reflux disease without esophagitis: Secondary | ICD-10-CM

## 2019-03-08 DIAGNOSIS — Z6834 Body mass index (BMI) 34.0-34.9, adult: Secondary | ICD-10-CM | POA: Diagnosis not present

## 2019-03-08 DIAGNOSIS — Z79899 Other long term (current) drug therapy: Secondary | ICD-10-CM | POA: Insufficient documentation

## 2019-03-08 DIAGNOSIS — Z87891 Personal history of nicotine dependence: Secondary | ICD-10-CM | POA: Insufficient documentation

## 2019-03-08 DIAGNOSIS — K449 Diaphragmatic hernia without obstruction or gangrene: Secondary | ICD-10-CM | POA: Diagnosis not present

## 2019-03-08 DIAGNOSIS — K210 Gastro-esophageal reflux disease with esophagitis: Secondary | ICD-10-CM | POA: Insufficient documentation

## 2019-03-08 DIAGNOSIS — R12 Heartburn: Secondary | ICD-10-CM | POA: Insufficient documentation

## 2019-03-08 DIAGNOSIS — F909 Attention-deficit hyperactivity disorder, unspecified type: Secondary | ICD-10-CM | POA: Diagnosis not present

## 2019-03-08 DIAGNOSIS — E669 Obesity, unspecified: Secondary | ICD-10-CM | POA: Insufficient documentation

## 2019-03-08 DIAGNOSIS — K209 Esophagitis, unspecified: Secondary | ICD-10-CM | POA: Diagnosis not present

## 2019-03-08 HISTORY — PX: ESOPHAGOGASTRODUODENOSCOPY: SHX5428

## 2019-03-08 LAB — SARS CORONAVIRUS 2 (TAT 6-24 HRS): SARS Coronavirus 2: NEGATIVE

## 2019-03-08 SURGERY — EGD (ESOPHAGOGASTRODUODENOSCOPY)
Anesthesia: Moderate Sedation

## 2019-03-08 MED ORDER — SODIUM CHLORIDE 0.9 % IV SOLN
INTRAVENOUS | Status: DC
  Administered 2019-03-08: 08:00:00 via INTRAVENOUS

## 2019-03-08 MED ORDER — ONDANSETRON HCL 4 MG/2ML IJ SOLN
INTRAMUSCULAR | Status: DC | PRN
  Administered 2019-03-08: 4 mg via INTRAVENOUS

## 2019-03-08 MED ORDER — LIDOCAINE VISCOUS HCL 2 % MT SOLN
OROMUCOSAL | Status: DC | PRN
  Administered 2019-03-08: 6 mL via OROMUCOSAL

## 2019-03-08 MED ORDER — ONDANSETRON HCL 4 MG/2ML IJ SOLN
INTRAMUSCULAR | Status: AC
  Filled 2019-03-08: qty 2

## 2019-03-08 MED ORDER — MEPERIDINE HCL 50 MG/ML IJ SOLN
INTRAMUSCULAR | Status: AC
  Filled 2019-03-08: qty 1

## 2019-03-08 MED ORDER — MEPERIDINE HCL 100 MG/ML IJ SOLN
INTRAMUSCULAR | Status: DC | PRN
  Administered 2019-03-08: 25 mg via INTRAVENOUS
  Administered 2019-03-08: 15 mg via INTRAVENOUS
  Administered 2019-03-08: 10 mg via INTRAVENOUS

## 2019-03-08 MED ORDER — MIDAZOLAM HCL 5 MG/5ML IJ SOLN
INTRAMUSCULAR | Status: AC
  Filled 2019-03-08: qty 10

## 2019-03-08 MED ORDER — MIDAZOLAM HCL 5 MG/5ML IJ SOLN
INTRAMUSCULAR | Status: DC | PRN
  Administered 2019-03-08 (×2): 2 mg via INTRAVENOUS
  Administered 2019-03-08: 1 mg via INTRAVENOUS
  Administered 2019-03-08: 2 mg via INTRAVENOUS

## 2019-03-08 MED ORDER — LIDOCAINE VISCOUS HCL 2 % MT SOLN
OROMUCOSAL | Status: AC
  Filled 2019-03-08: qty 15

## 2019-03-08 NOTE — H&P (Signed)
@LOGO @   Primary Care Physician:  Redmond School, MD Primary Gastroenterologist:  Dr. Gala Romney  Pre-Procedure History & Physical: HPI:  Kaylee Contreras is a 38 y.o. female here for further evaluation of GERD via EGD.  Switch from Protonix to [place] recently with significant-marked improvement in daily night reflux symptoms.  No dysphagia.  Past Medical History:  Diagnosis Date  . ADHD   . GERD (gastroesophageal reflux disease)   . Migraines   . Obesity   . 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 CESAREAN SECTION     x 1  . CHOLECYSTECTOMY    . kidney removed Left    donor for father who had FSGS    Prior to Admission medications   Medication Sig Start Date End Date Taking? Authorizing Provider  amphetamine-dextroamphetamine (ADDERALL) 10 MG tablet Take 10 mg by mouth daily as needed (attention).  12/27/18  Yes [provider]  dexlansoprazole (DEXILANT) 60 MG capsule Take 1 capsule (60 mg total) by mouth daily before breakfast. 01/03/19  Yes Mahala Menghini, PA-C  eletriptan (RELPAX) 20 MG tablet Take 20 mg by mouth daily as needed for migraine.  12/13/18  Yes [provider]  Galcanezumab-gnlm (EMGALITY) 120 MG/ML SOAJ Inject 120 mg into the skin every 30 (thirty) days. 01/11/19  Yes Penumalli, Earlean Polka, MD  phentermine (ADIPEX-P) 37.5 MG tablet Take 37.5 mg by mouth daily before breakfast.   Yes [provider]  sucralfate (CARAFATE) 1 g tablet Take 1 g by mouth 4 (four) times daily as needed (acid reflux symptoms).  01/03/19  Yes [provider]    Allergies as of 01/03/2019 - Review Complete 01/03/2019  Allergen Reaction Noted  . Iodinated diagnostic agents  01/03/2019    Family History  Problem Relation Age of Onset  . Renal Disease Father        FSGS  . High Cholesterol Mother   . Dementia Maternal Grandmother   . Lung disease Paternal Grandfather   .  Colon cancer Neg Hx     Social History   Socioeconomic History  . Marital status: Married    Spouse name: Not on file  . Number of children: 1  . Years of education: Not on file  . Highest education level: Bachelor's degree (e.g., BA, AB, BS)  Occupational History    Comment: insurance agent  Social Needs  . Financial resource strain: Not on file  . Food insecurity    Worry: Not on file    Inability: Not on file  . Transportation needs    Medical: Not on file    Non-medical: Not on file  Tobacco Use  . Smoking status: Former Smoker    Types: Cigarettes    Quit date: 01/03/2009    Years since quitting: 10.1  . Smokeless tobacco: Never Used  Substance and Sexual Activity  . Alcohol use: Yes    Comment: socially  . Drug use: Never  . Sexual activity: Not on file  Lifestyle  . Physical activity    Days per week: Not on file    Minutes per session: Not on file  . Stress: Not on file  Relationships  . Social Herbalist on phone: Not on file    Gets together: Not on file    Attends religious service: Not on file    Active member of club or  organization: Not on file    Attends meetings of clubs or organizations: Not on file    Relationship status: Not on file  . Intimate partner violence    Fear of current or ex partner: Not on file    Emotionally abused: Not on file    Physically abused: Not on file    Forced sexual activity: Not on file  Other Topics Concern  . Not on file  Social History Narrative   Lives with family   Caffeine- coffee 1 cup daily, may 1 glass tea    Review of Systems: See HPI, otherwise negative ROS  Physical Exam: BP 121/68   Pulse 82   Temp 99.3 F (37.4 C) (Oral)   Resp 12   Ht 5\' 2"  (1.575 m)   Wt 86.6 kg   LMP 02/27/2019   SpO2 98%   BMI 34.93 kg/m  General:   Alert,  Well-developed, well-nourished, pleasant and cooperative in NAD Neck:  Supple; no masses or thyromegaly. No significant cervical adenopathy. Lungs:   Clear throughout to auscultation.   No wheezes, crackles, or rhonchi. No acute distress. Heart:  Regular rate and rhythm; no murmurs, clicks, rubs,  or gallops. Abdomen: Non-distended, normal bowel sounds.  Soft and nontender without appreciable mass or hepatosplenomegaly.  Pulses:  Normal pulses noted. Extremities:  Without clubbing or edema.  Impression/Plan: Pleasant 38 year old lady here for EGD to further evaluate chronic GERD.  The risks, benefits, limitations, alternatives and imponderables have been reviewed with the patient. Potential for esophageal dilation, biopsy, etc. have also been reviewed.  Questions have been answered. All parties agreeable.     Notice: This dictation was prepared with Dragon dictation along with smaller phrase technology. Any transcriptional errors that result from this process are unintentional and may not be corrected upon review.

## 2019-03-08 NOTE — Op Note (Signed)
[facility] Patient Name: Kaylee Contreras Procedure Date: 03/08/2019 8:11 AM MRN: [id number] Date of Birth: 10-Feb-1981 Attending MD: Gennette Pac , MD CSN: [id number] Age: 38 Admit Type: Outpatient Procedure:                Upper GI endoscopy Indications:              Heartburn Providers:                Gennette Pac, MD, Judee Clara, RN, Edythe Clarity, Technician Referring MD:              Medicines:                Midazolam 7 mg IV, Meperidine 50 mg IV Complications:            No immediate complications. Estimated Blood Loss:     Estimated blood loss: none. Procedure:                Pre-Anesthesia Assessment:                           - Prior to the procedure, a History and Physical                            was performed, and patient medications and                            allergies were reviewed. The patient's tolerance of                            previous anesthesia was also reviewed. The risks                            and benefits of the procedure and the sedation                            options and risks were discussed with the patient.                            All questions were answered, and informed consent                            was obtained. Prior Anticoagulants: The patient has                            taken no previous anticoagulant or antiplatelet                            agents. ASA Grade Assessment: II - A patient with                            mild systemic disease. After reviewing the risks  and benefits, the patient was deemed in                            satisfactory condition to undergo the procedure.                           After obtaining informed consent, the endoscope was                            passed under direct vision. Throughout the                            procedure, the patient's blood pressure, pulse, and                            oxygen saturations  were monitored continuously. The                            GIF-H190 ([id number] scope was introduced through the                            mouth, and advanced to the second part of duodenum.                            The upper GI endoscopy was accomplished without                            difficulty. The patient tolerated the procedure                            well. Scope In: 8:56:25 AM Scope Out: 8:58:49 AM Total Procedure Duration: 0 hours 2 minutes 24 seconds  Findings:      Esophagitis was found. Multiple tiny erosions within 5 mm the GE       junction. No Barrett's epithelium seen.      A small hiatal hernia was present.      The exam was otherwise without abnormality.      The duodenal bulb and second portion of the duodenum were normal. Impression:               -Mild erosive reflux esophagitis. Esophagitis.                            Clinically, esophagitis is likely healing as                            patient has derived significant improvement with                            switching to Dexilant from Protonix.                           - Small hiatal hernia.                           - The examination  was otherwise normal.                           - Normal duodenal bulb and second portion of the                            duodenum.                           - No specimens collected. Moderate Sedation:      Moderate (conscious) sedation was administered by the endoscopy nurse       and supervised by the endoscopist. The following parameters were       monitored: oxygen saturation, heart rate, blood pressure, respiratory       rate, EKG, adequacy of pulmonary ventilation, and response to care.       Total physician intraservice time was 19 minutes. Recommendation:           - Patient has a contact number available for                            emergencies. The signs and symptoms of potential                            delayed complications were discussed with the                             patient. Return to normal activities tomorrow.                            Written discharge instructions were provided to the                            patient.                           - Resume previous diet.                           - Patient has a contact number available for                            emergencies. The signs and symptoms of potential                            delayed complications were discussed with the                            patient. Return to normal activities tomorrow.                            Written discharge instructions were provided to the                            patient.                           -  Advance diet as tolerated.                           - Continue present medications. Continue Dexilant                            60 mg daily                           - Return to my office in 3 months. Procedure Code(s):        --- Professional ---                           [phone], Esophagogastroduodenoscopy, flexible,                            transoral; diagnostic, including collection of                            specimen(s) by brushing or washing, when performed                            (separate procedure)                           G0500, Moderate sedation services provided by the                            same physician or other qualified health care                            professional performing a gastrointestinal                            endoscopic service that sedation supports,                            requiring the presence of an independent trained                            observer to assist in the monitoring of the                            patient's level of consciousness and physiological                            status; initial 15 minutes of intra-service time;                            patient age 39 years or older (additional time may                            be reported with [phone], as  appropriate) Diagnosis Code(s):        --- Professional ---  K20.9, Esophagitis, unspecified                           K44.9, Diaphragmatic hernia without obstruction or                            gangrene                           R12, Heartburn CPT copyright 2019 American Medical Association. All rights reserved. The codes documented in this report are preliminary and upon coder review may  be revised to meet current compliance requirements. Gerrit Friendsobert M. Eusebio Blazejewski, MD Gennette Pacobert Michael Abril Cappiello, MD 03/08/2019 9:08:43 AM This report has been signed electronically. Number of Addenda: 0

## 2019-03-08 NOTE — Discharge Instructions (Signed)

## 2019-03-13 ENCOUNTER — Encounter (HOSPITAL_COMMUNITY): Payer: Self-pay | Admitting: Internal Medicine

## 2019-04-04 ENCOUNTER — Telehealth: Payer: Self-pay | Admitting: Internal Medicine

## 2019-04-04 NOTE — Telephone Encounter (Signed)
Reviewed

## 2019-04-04 NOTE — Telephone Encounter (Signed)
Called patient. She states she received an EOB that shows procedure was not authorized through insurance for EGD. Insurance card was not scanned into chart until 7/8 day of procedure. In chart insurance shows it is [place]. Does not show [place] in chart but on card it states it is [place]. I advised will call her insurance. She stated I needed to call [employer] at [phone] and she has ref# [id number].  I called and spoke with Sharyn Lull. She states patient was billed for "operating room services" and this does require PA. She advised me to fax all notes to them at [phone].

## 2019-04-04 NOTE — Telephone Encounter (Signed)
[phone] PATIENT CALLED AND SAID THAT HER PROCEDURE WAS NOT COVERED BECAUSE A PRIOR AUTH WAS NOT DONE ACCORDING TO HER INSURANCE.  SHE RECEIVED A LETTER FROM HER INSURANCE

## 2019-04-11 NOTE — Telephone Encounter (Signed)
I called [employer]. Was advised the last representative directed me to the wrong department. She will get the information passed to correct team.

## 2019-04-13 NOTE — Telephone Encounter (Signed)
[employer] has approved PA for retro auth for EGD code [phone]. Auth# [id number] DOS 03/08/2019.  Rosendo Gros, please advise if billing needs to resubmit claim? Thanks!

## 2019-04-15 NOTE — Telephone Encounter (Signed)
I will make the billing department aware.

## 2019-05-01 ENCOUNTER — Telehealth: Payer: Self-pay | Admitting: *Deleted

## 2019-05-01 NOTE — Telephone Encounter (Signed)
Routing to [facility] refill. Pt's last ov was 12/2018

## 2019-05-01 NOTE — Telephone Encounter (Signed)
Pt wants a 90 day supply of Dexilant sent to [facility].  Her insurance requires it to be a 90 day supply. [phone]

## 2019-05-02 MED ORDER — DEXILANT 60 MG PO CPDR: 60.0000 mg | DELAYED_RELEASE_CAPSULE | Freq: Every day | ORAL | 3 refills | Status: DC

## 2019-05-02 NOTE — Addendum Note (Signed)
Addended by: Annitta Needs on: 05/02/2019 08:35 AM   Modules accepted: Orders

## 2019-05-02 NOTE — Telephone Encounter (Signed)
Done

## 2019-05-15 ENCOUNTER — Other Ambulatory Visit: Payer: Self-pay

## 2019-05-15 DIAGNOSIS — R6889 Other general symptoms and signs: Secondary | ICD-10-CM | POA: Diagnosis not present

## 2019-05-15 DIAGNOSIS — Z20822 Contact with and (suspected) exposure to covid-19: Secondary | ICD-10-CM

## 2019-05-16 ENCOUNTER — Other Ambulatory Visit: Payer: Self-pay | Admitting: *Deleted

## 2019-05-16 LAB — NOVEL CORONAVIRUS, NAA: SARS-CoV-2, NAA: NOT DETECTED

## 2019-05-16 MED ORDER — EMGALITY 120 MG/ML ~~LOC~~ SOAJ: 120.0000 mg | SUBCUTANEOUS | 4 refills | Status: DC

## 2019-05-18 ENCOUNTER — Telehealth: Payer: Self-pay

## 2019-05-18 DIAGNOSIS — K219 Gastro-esophageal reflux disease without esophagitis: Secondary | ICD-10-CM

## 2019-05-18 NOTE — Telephone Encounter (Signed)
New Refill request received from [facility] mail delivery for Dexilant 60 mg. Pt's insurance will no longer cover the [place] if picked up at the local pharmacy. Pt would like new RX sent to [facility] mail delivery.

## 2019-05-19 MED ORDER — DEXILANT 60 MG PO CPDR: 60.0000 mg | DELAYED_RELEASE_CAPSULE | Freq: Every day | ORAL | 3 refills | Status: DC

## 2019-05-19 NOTE — Addendum Note (Signed)
Addended by: Gordy Levan, ERIC A on: 05/19/2019 03:41 PM   Modules accepted: Orders

## 2019-05-19 NOTE — Telephone Encounter (Signed)
Rx sent to [facility] as requested 

## 2019-05-22 NOTE — Telephone Encounter (Signed)
Noted  

## 2019-06-18 NOTE — Progress Notes (Signed)
Primary Care Physician:  Elfredia Nevins, MD  Primary GI: Dr. Jena Gauss  Patient Location: Home   Provider Location: [facility] office   Reason for Visit: Post procedure and GERD follow-up   Persons present on the virtual encounter, with roles: Ermalinda Memos, PA-C (provider); Kaylee Contreras (patient)    Total time (minutes) spent on medical discussion: 12 minutes   Due to COVID-19, visit was conducted using virtual method.  Visit was requested by patient.  Virtual Visit via Telephone Note Due to COVID-19, visit is conducted virtually and was requested by patient.   I connected with [name] on 06/19/19 at 10:30 AM EDT by telephone and verified that I am speaking with the correct person using two identifiers.   I discussed the limitations, risks, security and privacy concerns of performing an evaluation and management service by telephone and the availability of in person appointments. I also discussed with the patient that there may be a patient responsible charge related to this service. The patient expressed understanding and agreed to proceed.  Chief Complaint  Patient presents with  . Gastroesophageal Reflux    pp f/u, "little bit but med has helped"     History of Present Illness:  Kaylee Contreras is a 38 y.o. female presenting for follow-up of GERD.  She was last seen in our office on 01/03/2019 with uncontrolled GERD, epigastric burning, sensation of pressure/urge to burp that does not resolve, nausea, and nocturnal vomiting on Protonix twice daily.  At that time Protonix was switched to Dexilant 60 mg daily and Carafate was added for breakthrough reflux symptoms.  Patient was scheduled for upper endoscopy.  EGD completed 03/08/2019 with mild erosive reflux esophagitis.  Suspected this was healing as patient reported significant improvement with Dexilant.  Small hiatal hernia also noted.  Exam was otherwise normal.   Today she states she is doing much better. Dexilant has helped  significantly. Breakthrough GERD symptoms with tomato based sauces or greasy foods or if she forgets to take the Dexilant, which is rare. Still consumes greasy/fast foods due to busy lifestyle. Has used carafate maybe 2-3 times since May 2020 for breakthrough. No nausea or vomiting. No dysphagia. No unintentional weight loss. No abdominal pain. BMs daily to every other day. Constipation at times. Does not go more than a couple days without a BM.   Has generic MiraLAX which she will take if needed. This works well. Has not had any constipation lately. Reports she doesn't drink enough water. Occasional diarrhea. No blood in the stool. No black stools.    Has used carafate maybe 2-3 times since May 2020 for breakthrough.    Past Medical History:  Diagnosis Date  . ADHD   . GERD (gastroesophageal reflux disease)   . Migraines   . Obesity   . 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 CESAREAN SECTION     x 1  . CHOLECYSTECTOMY    . ESOPHAGOGASTRODUODENOSCOPY N/A 03/08/2019   Procedure: ESOPHAGOGASTRODUODENOSCOPY (EGD);  Surgeon: Corbin Ade, MD;  Location: [facility];  Service: Endoscopy;  Laterality: N/A;  8:30am  . kidney removed Left    donor for father who had FSGS     Current Meds  Medication Sig  . amphetamine-dextroamphetamine (ADDERALL) 10 MG tablet Take 10 mg by mouth daily as needed (attention).   Marland Kitchen dexlansoprazole (DEXILANT) 60 MG capsule Take 1  capsule (60 mg total) by mouth daily before breakfast.  . eletriptan (RELPAX) 20 MG tablet Take 20 mg by mouth daily as needed for migraine.   . Galcanezumab-gnlm (EMGALITY) 120 MG/ML SOAJ Inject 120 mg into the skin every 30 (thirty) days.  . sucralfate (CARAFATE) 1 g tablet Take 1 g by mouth 4 (four) times daily as needed (acid reflux symptoms).      Family History  Problem Relation Age of Onset  . Renal Disease Father        FSGS  . High  Cholesterol Mother   . Dementia Maternal Grandmother   . Lung disease Paternal Grandfather   . Colon cancer Neg Hx     Social History   Socioeconomic History  . Marital status: Married    Spouse name: Not on file  . Number of children: 1  . Years of education: Not on file  . Highest education level: Bachelor's degree (e.g., BA, AB, BS)  Occupational History    Comment: insurance agent  Social Needs  . Financial resource strain: Not on file  . Food insecurity    Worry: Not on file    Inability: Not on file  . Transportation needs    Medical: Not on file    Non-medical: Not on file  Tobacco Use  . Smoking status: Former Smoker    Types: Cigarettes    Quit date: 01/03/2009    Years since quitting: 10.4  . Smokeless tobacco: Never Used  Substance and Sexual Activity  . Alcohol use: Yes    Comment: socially  . Drug use: Never  . Sexual activity: Not on file  Lifestyle  . Physical activity    Days per week: Not on file    Minutes per session: Not on file  . Stress: Not on file  Relationships  . Social Herbalist on phone: Not on file    Gets together: Not on file    Attends religious service: Not on file    Active member of club or organization: Not on file    Attends meetings of clubs or organizations: Not on file    Relationship status: Not on file  Other Topics Concern  . Not on file  Social History Narrative   Lives with family   Caffeine- coffee 1 cup daily, may 1 glass tea       Review of Systems: Gen: Denies fever, chills, lightheadedness, dizziness, or feeling like she will pass out.  CV: Denies chest pain, palpitations Resp: Denies dyspnea or cough.  GI: see HPI Derm: Denies rash Psych: Denies depression, anxiety. Admits to stress.  Heme: Denies bruising, bleeding  Observations/Objective: No distress. Unable to perform physical exam due to telephone encounter. No video available.   Assessment and Plan: 38 y.o female with history of GERD  previously uncontrolled on Protonix 40 mg BID. Medication was changed to Dexilant 60 mg and patient underwent EGD for further evaluation on 03/08/2019 which revealed mild erosive reflux esophagitis and small hiatal hernia, otherwise normal. She has continued on Dexilant daily and is doing very well with minimal breakthrough symptoms occurring if she consumes known trigger foods or forgets to take [place]. Has used carafate 2-3 times since May 2020. She does continue to consume fast food due to busy lifestyle. Plan to continue Dexilant 60 mg daily. Discussed GERD diet and the importance in avoiding known trigger foods including tomato based products and fatty/greasy foods. GERD handout provided. Advised to avoid NSAIDs.  Occasional constipation: BMs daily to every other day. May go up to 2 days without a BM for which she will use generic MiraLAX. This works well for her. No alarm symptoms. Suspect mild constipation is related to diet and lifestyle.  Advised she increase her water intake and add benefiber or metamucil daily to help with bowel regularity. May continue to use MiraLAX as needed.   Follow-up in 6 months. Advised to call if questions or concerns prior to her next visit.   Follow Up Instructions: Follow-up in 6 months.     I discussed the assessment and treatment plan with the patient. The patient was provided an opportunity to ask questions and all were answered. The patient agreed with the plan and demonstrated an understanding of the instructions.   The patient was advised to call back or seek an in-person evaluation if the symptoms worsen or if the condition fails to improve as anticipated.  I provided 12 minutes of non-face-to-face time during this encounter.  Ermalinda MemosKristen Byrant Valent, PA-C [facility]

## 2019-06-19 ENCOUNTER — Ambulatory Visit: Payer: BC Managed Care – PPO | Admitting: Nurse Practitioner

## 2019-06-19 ENCOUNTER — Encounter: Payer: Self-pay | Admitting: Gastroenterology

## 2019-06-19 ENCOUNTER — Ambulatory Visit (INDEPENDENT_AMBULATORY_CARE_PROVIDER_SITE_OTHER): Payer: BC Managed Care – PPO | Admitting: Gastroenterology

## 2019-06-19 ENCOUNTER — Other Ambulatory Visit: Payer: Self-pay

## 2019-06-19 ENCOUNTER — Encounter: Payer: Self-pay | Admitting: Internal Medicine

## 2019-06-19 DIAGNOSIS — K2100 Gastro-esophageal reflux disease with esophagitis, without bleeding: Secondary | ICD-10-CM

## 2019-06-19 DIAGNOSIS — K5900 Constipation, unspecified: Secondary | ICD-10-CM

## 2019-06-19 NOTE — Patient Instructions (Addendum)
Continue Dexilant 60 mg daily for GERD.   Please try to follow GERD diet. Avoiding spicy, fatty, fried, and greasy foods. Avoid caffeine and carbonated beverages. See handout below.    Avoid NSAIDs including ibuprofen, Advil, Aleve, and Goody Powders.   Add Benefiber or Metamucil daily to help with bowel regularity.   We will plan to see you back in 6 months. Call if questions or concerns prior to your visit.   I am glad you are doing much better!  Aliene Altes, PA-C [facility]   Food Choices for Gastroesophageal Reflux Disease, Adult When you have gastroesophageal reflux disease (GERD), the foods you eat and your eating habits are very important. Choosing the right foods can help ease your discomfort. Think about working with a nutrition specialist (dietitian) to help you make good choices. What are tips for following this plan?  Meals  Choose healthy foods that are low in fat, such as fruits, vegetables, whole grains, low-fat dairy products, and lean meat, fish, and poultry.  Eat small meals often instead of 3 large meals a day. Eat your meals slowly, and in a place where you are relaxed. Avoid bending over or lying down until 2-3 hours after eating.  Avoid eating meals 2-3 hours before bed.  Avoid drinking a lot of liquid with meals.  Cook foods using methods other than frying. Bake, grill, or broil food instead.  Avoid or limit: ? Chocolate. ? Peppermint or spearmint. ? Alcohol. ? Pepper. ? Black and decaffeinated coffee. ? Black and decaffeinated tea. ? Bubbly (carbonated) soft drinks. ? Caffeinated energy drinks and soft drinks.  Limit high-fat foods such as: ? Fatty meat or fried foods. ? Whole milk, cream, butter, or ice cream. ? Nuts and nut butters. ? Pastries, donuts, and sweets made with butter or shortening.  Avoid foods that cause symptoms. These foods may be different for everyone. Common foods that cause symptoms  include: ? Tomatoes. ? Oranges, lemons, and limes. ? Peppers. ? Spicy food. ? Onions and garlic. ? Vinegar. Lifestyle  Maintain a healthy weight. Ask your doctor what weight is healthy for you. If you need to lose weight, work with your doctor to do so safely.  Exercise for at least 30 minutes for 5 or more days each week, or as told by your doctor.  Wear loose-fitting clothes.  Do not smoke. If you need help quitting, ask your doctor.  Sleep with the head of your bed higher than your feet. Use a wedge under the mattress or blocks under the bed frame to raise the head of the bed. Summary  When you have gastroesophageal reflux disease (GERD), food and lifestyle choices are very important in easing your symptoms.  Eat small meals often instead of 3 large meals a day. Eat your meals slowly, and in a place where you are relaxed.  Limit high-fat foods such as fatty meat or fried foods.  Avoid bending over or lying down until 2-3 hours after eating.  Avoid peppermint and spearmint, caffeine, alcohol, and chocolate. This information is not intended to replace advice given to you by your health care provider. Make sure you discuss any questions you have with your health care provider. Document Released: 02/16/2012 Document Revised: 12/08/2018 Document Reviewed: 09/22/2016 Elsevier Patient Education  2020 [employer].

## 2019-06-26 DIAGNOSIS — Z0001 Encounter for general adult medical examination with abnormal findings: Secondary | ICD-10-CM | POA: Diagnosis not present

## 2019-06-26 DIAGNOSIS — G4700 Insomnia, unspecified: Secondary | ICD-10-CM | POA: Diagnosis not present

## 2019-06-26 DIAGNOSIS — F909 Attention-deficit hyperactivity disorder, unspecified type: Secondary | ICD-10-CM | POA: Diagnosis not present

## 2019-06-26 DIAGNOSIS — Z6835 Body mass index (BMI) 35.0-35.9, adult: Secondary | ICD-10-CM | POA: Diagnosis not present

## 2019-06-26 DIAGNOSIS — N181 Chronic kidney disease, stage 1: Secondary | ICD-10-CM | POA: Diagnosis not present

## 2019-06-26 DIAGNOSIS — Z23 Encounter for immunization: Secondary | ICD-10-CM | POA: Diagnosis not present

## 2019-06-26 DIAGNOSIS — E669 Obesity, unspecified: Secondary | ICD-10-CM | POA: Diagnosis not present

## 2019-07-11 ENCOUNTER — Encounter: Payer: Self-pay | Admitting: *Deleted

## 2019-07-13 ENCOUNTER — Encounter: Payer: Self-pay | Admitting: *Deleted

## 2019-07-13 ENCOUNTER — Telehealth: Payer: Self-pay | Admitting: *Deleted

## 2019-07-13 NOTE — Telephone Encounter (Signed)
Emgality requires PA through [employer]. CMM key: [id number]. Sent patient my chart message asking her to schedule FU with NP. Asked her if she's had reduction in migraines and use of Elitriptan after starting Emgality. Advised her I need this information to complete Emgality PA.

## 2019-07-13 NOTE — Telephone Encounter (Addendum)
Received from patient via my chart: The Emgality is wonderful! I have went down to just a few per month and all those were for reasons like the storm today (changes in weather/pressure) or hormonal (start of menstrual cycle). So yes, I take A LOT less of the Relpax and I've completely quit taking any Excedrin Migraine, which my GI doctor (for GERD) is happy about too. She stated she will call and schedule FU. PA submitted to [facility] on CMM.

## 2019-07-13 NOTE — Telephone Encounter (Signed)
Per CMM: Request Reference Number: [id number]. EMGALITY INJ 120MG /ML is approved through 07/12/2020. Notified patient via my chart.

## 2019-08-28 ENCOUNTER — Other Ambulatory Visit: Payer: Self-pay

## 2019-08-28 ENCOUNTER — Ambulatory Visit: Payer: BC Managed Care – PPO | Attending: Internal Medicine

## 2019-08-28 DIAGNOSIS — Z20822 Contact with and (suspected) exposure to covid-19: Secondary | ICD-10-CM

## 2019-08-28 DIAGNOSIS — Z20828 Contact with and (suspected) exposure to other viral communicable diseases: Secondary | ICD-10-CM | POA: Diagnosis not present

## 2019-08-30 LAB — NOVEL CORONAVIRUS, NAA: SARS-CoV-2, NAA: DETECTED — AB

## 2019-09-04 DIAGNOSIS — Z8619 Personal history of other infectious and parasitic diseases: Secondary | ICD-10-CM | POA: Diagnosis not present

## 2019-09-05 ENCOUNTER — Other Ambulatory Visit (HOSPITAL_COMMUNITY): Payer: Self-pay | Admitting: Internal Medicine

## 2019-09-05 ENCOUNTER — Other Ambulatory Visit: Payer: Self-pay

## 2019-09-05 ENCOUNTER — Ambulatory Visit (HOSPITAL_COMMUNITY)
Admission: RE | Admit: 2019-09-05 | Discharge: 2019-09-05 | Disposition: A | Discharge status: Home / Self Care | Payer: BC Managed Care – PPO | Source: Ambulatory Visit | Attending: Internal Medicine | Admitting: Internal Medicine

## 2019-09-05 DIAGNOSIS — R059 Cough, unspecified: Secondary | ICD-10-CM

## 2019-09-05 DIAGNOSIS — R05 Cough: Secondary | ICD-10-CM

## 2019-09-05 DIAGNOSIS — R0600 Dyspnea, unspecified: Secondary | ICD-10-CM | POA: Diagnosis not present

## 2019-09-05 DIAGNOSIS — Z681 Body mass index (BMI) 19 or less, adult: Secondary | ICD-10-CM | POA: Diagnosis not present

## 2019-09-05 DIAGNOSIS — R079 Chest pain, unspecified: Secondary | ICD-10-CM | POA: Diagnosis not present

## 2019-09-22 DIAGNOSIS — U071 COVID-19: Secondary | ICD-10-CM | POA: Diagnosis not present

## 2019-09-22 DIAGNOSIS — Z6835 Body mass index (BMI) 35.0-35.9, adult: Secondary | ICD-10-CM | POA: Diagnosis not present

## 2019-09-22 DIAGNOSIS — J1282 Pneumonia due to coronavirus disease 2019: Secondary | ICD-10-CM | POA: Diagnosis not present

## 2019-09-22 DIAGNOSIS — E6609 Other obesity due to excess calories: Secondary | ICD-10-CM | POA: Diagnosis not present

## 2019-12-18 ENCOUNTER — Ambulatory Visit: Payer: BC Managed Care – PPO | Admitting: Gastroenterology

## 2019-12-18 ENCOUNTER — Encounter: Payer: Self-pay | Admitting: Gastroenterology

## 2019-12-18 ENCOUNTER — Other Ambulatory Visit: Payer: Self-pay

## 2019-12-18 VITALS — BP 117/74 | HR 81 | Temp 97.1°F | Ht 62.0 in | Wt 192.2 lb

## 2019-12-18 DIAGNOSIS — K2100 Gastro-esophageal reflux disease with esophagitis, without bleeding: Secondary | ICD-10-CM

## 2019-12-18 NOTE — Progress Notes (Signed)
      Primary Care Physician: Elfredia Nevins, MD  Primary Gastroenterologist:  Roetta Sessions, MD   Chief Complaint  Patient presents with  . Gastroesophageal Reflux    occas flare depending on what she eats otherswise doing fine     HPI: Kaylee Contreras is a 39 y.o. female here for follow-up.  Last seen in October 2020 via telephone visit.  She has a history of GERD and occasional constipation.  EGD completed in July 2020 with mild erosive reflux esophagitis.  Small hiatal hernia.  Suspected to be healing as patient reported significant improvement with Dexilant.  No longer on Excedrin Migraine. Has physical labs she needs to do in near future to follow up on creatinine. 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 She needs to increase her fluid consumption. This usually helps with her constipation.  Generally has a bowel movement every couple of days.  Uses MiraLAX when she needs it.  She denies melena, brbpr. No n/v. No dysphagia.  Heartburn generally is well controlled unless she eats pizza or spaghetti.  She does have to watch certain foods that trigger her reflux.       Current Outpatient Medications  Medication Sig Dispense Refill  . amphetamine-dextroamphetamine (ADDERALL) 10 MG tablet Take 10 mg by mouth daily as needed (attention).     Marland Kitchen dexlansoprazole (DEXILANT) 60 MG capsule Take 1 capsule (60 mg total) by mouth daily before breakfast. 90 capsule 3  . eletriptan (RELPAX) 20 MG tablet Take 20 mg by mouth daily as needed for migraine.     . Galcanezumab-gnlm (EMGALITY) 120 MG/ML SOAJ Inject 120 mg into the skin every 30 (thirty) days. 3 pen 4  . sucralfate (CARAFATE) 1 g tablet Take 1 g by mouth 4 (four) times daily as needed (acid reflux symptoms).      No current facility-administered medications for this visit.    Allergies as of 12/18/2019 - Review Complete 12/18/2019  Allergen Reaction Noted  . Iodinated diagnostic agents  01/03/2019  .  Nsaids Other (See Comments) 01/04/2019  . Other  03/08/2019  . Prednisone Other (See Comments) 01/04/2019    ROS:  General: Negative for anorexia, weight loss, fever, chills, fatigue, weakness. ENT: Negative for hoarseness, difficulty swallowing , nasal congestion. CV: Negative for chest pain, angina, palpitations, dyspnea on exertion, peripheral edema.  Respiratory: Negative for dyspnea at rest, dyspnea on exertion, cough, sputum, wheezing.  GI: See history of present illness. GU:  Negative for dysuria, hematuria, urinary incontinence, urinary frequency, nocturnal urination.  Endo: Negative for unusual weight change.    Physical Examination:   BP 117/74   Pulse 81   Temp (!) 97.1 F (36.2 C) (Oral)   Ht 5\' 2"  (1.575 m)   Wt 192 lb 3.2 oz (87.2 kg)   LMP 11/23/2019   BMI 35.15 kg/m   General: Well-nourished, well-developed in no acute distress.  Eyes: No icterus. Mouth: maksed  Abdomen: Bowel sounds are normal, nontender, nondistended, no hepatosplenomegaly or masses, no abdominal bruits or hernia , no rebound or guarding.   Extremities: No lower extremity edema. No clubbing or deformities. Neuro: Alert and oriented x 4   Skin: Warm and dry, no jaundice.   Psych: Alert and cooperative, normal mood and affect.   Imaging Studies: No results found.

## 2019-12-18 NOTE — Assessment & Plan Note (Signed)
Doing well.  Continues to require daily Dexilant.  If she misses a dose she has recurrent significant symptoms.  Encouraged her to continue to follow with her PCP for routine labs.  Continue to keep an eye on her creatinine given she has a single kidney.  Avoid NSAIDs and aspirin because of this.  Encouraged dietary modifications, exercise, weight loss to help control reflux symptoms.  We will see her back in 1 year or sooner if needed.

## 2019-12-18 NOTE — Patient Instructions (Signed)
1. Continue Dexilant 60mg  daily before a meal.  2. Continue to work on your diet and exercise for weight management. This will go a long way in managing your reflux as well.  3. Return to the office in one year or call sooner if needed.

## 2020-01-05 DIAGNOSIS — E6609 Other obesity due to excess calories: Secondary | ICD-10-CM | POA: Diagnosis not present

## 2020-01-05 DIAGNOSIS — Z6835 Body mass index (BMI) 35.0-35.9, adult: Secondary | ICD-10-CM | POA: Diagnosis not present

## 2020-01-05 DIAGNOSIS — H938X3 Other specified disorders of ear, bilateral: Secondary | ICD-10-CM | POA: Diagnosis not present

## 2020-01-05 DIAGNOSIS — H9393 Unspecified disorder of ear, bilateral: Secondary | ICD-10-CM | POA: Diagnosis not present

## 2020-02-28 IMAGING — DX DG CHEST 2V
2 series · 2 of 2 positions shown · non-contrast
Comparison: None.

CLINICAL DATA: Cough.

EXAM:
CHEST - 2 VIEW

[chest pa]
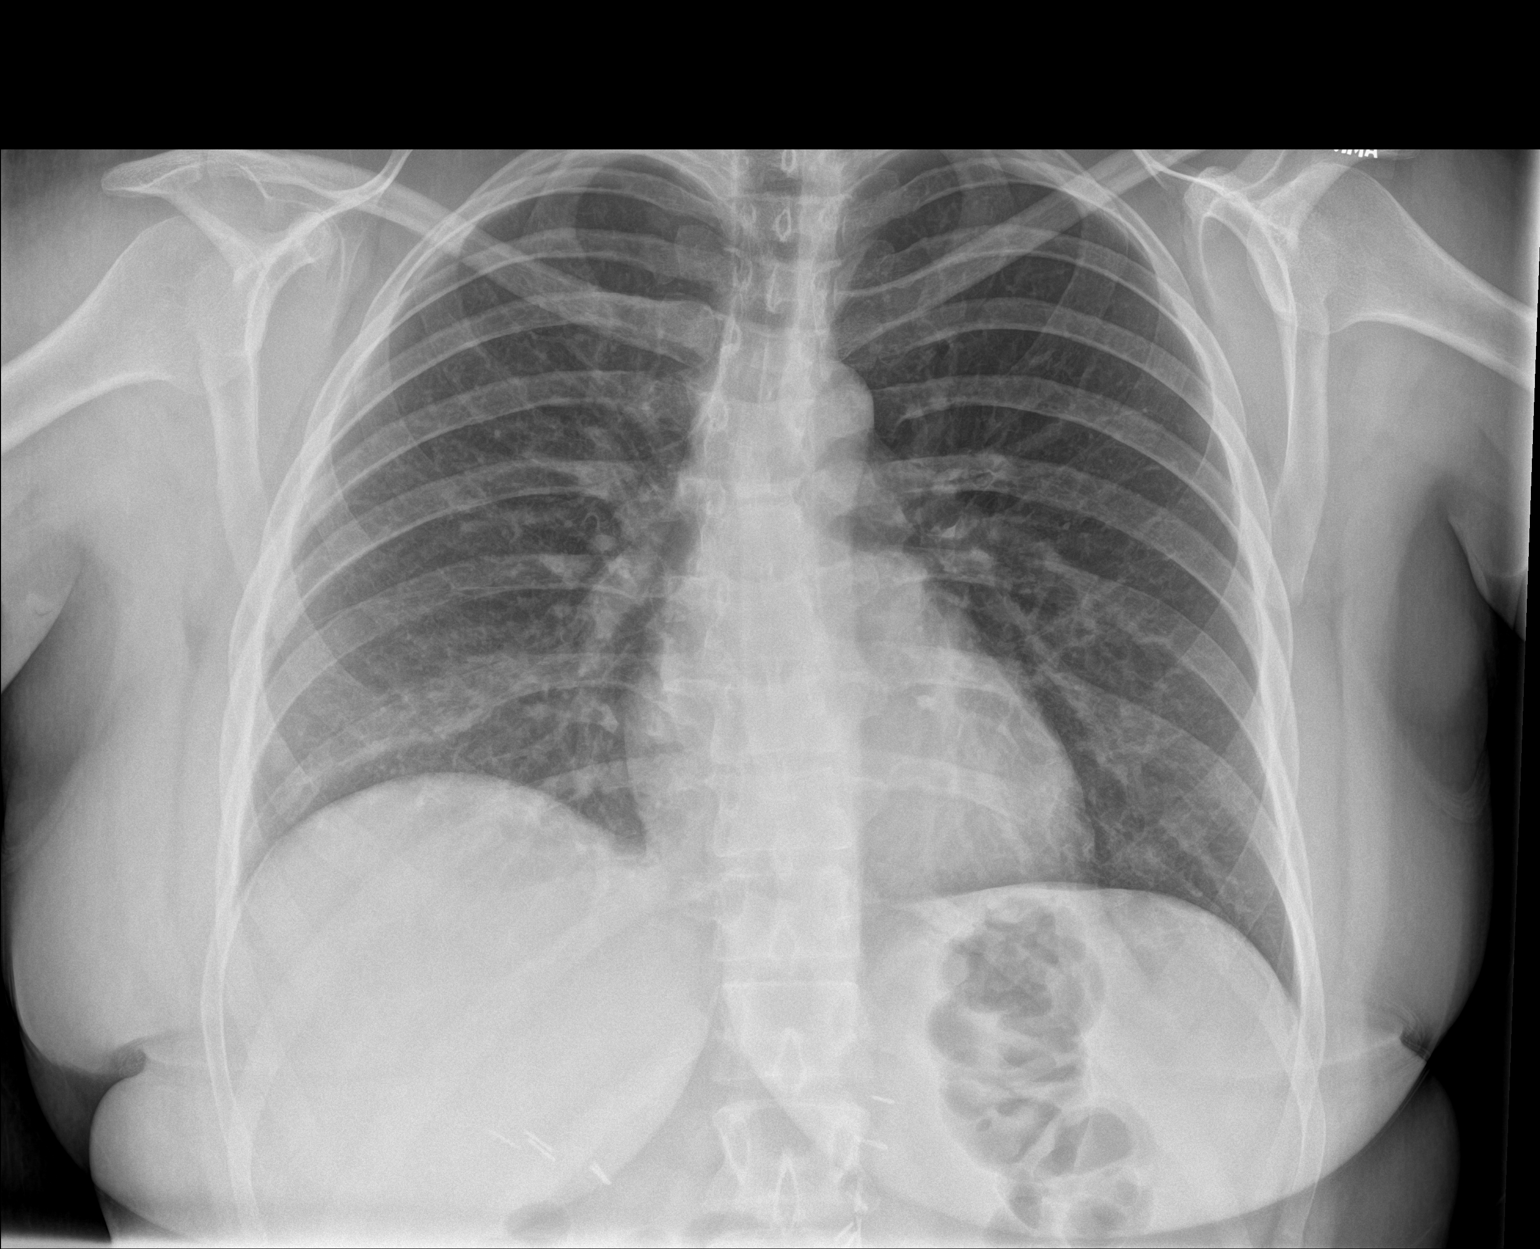

[chest lat]
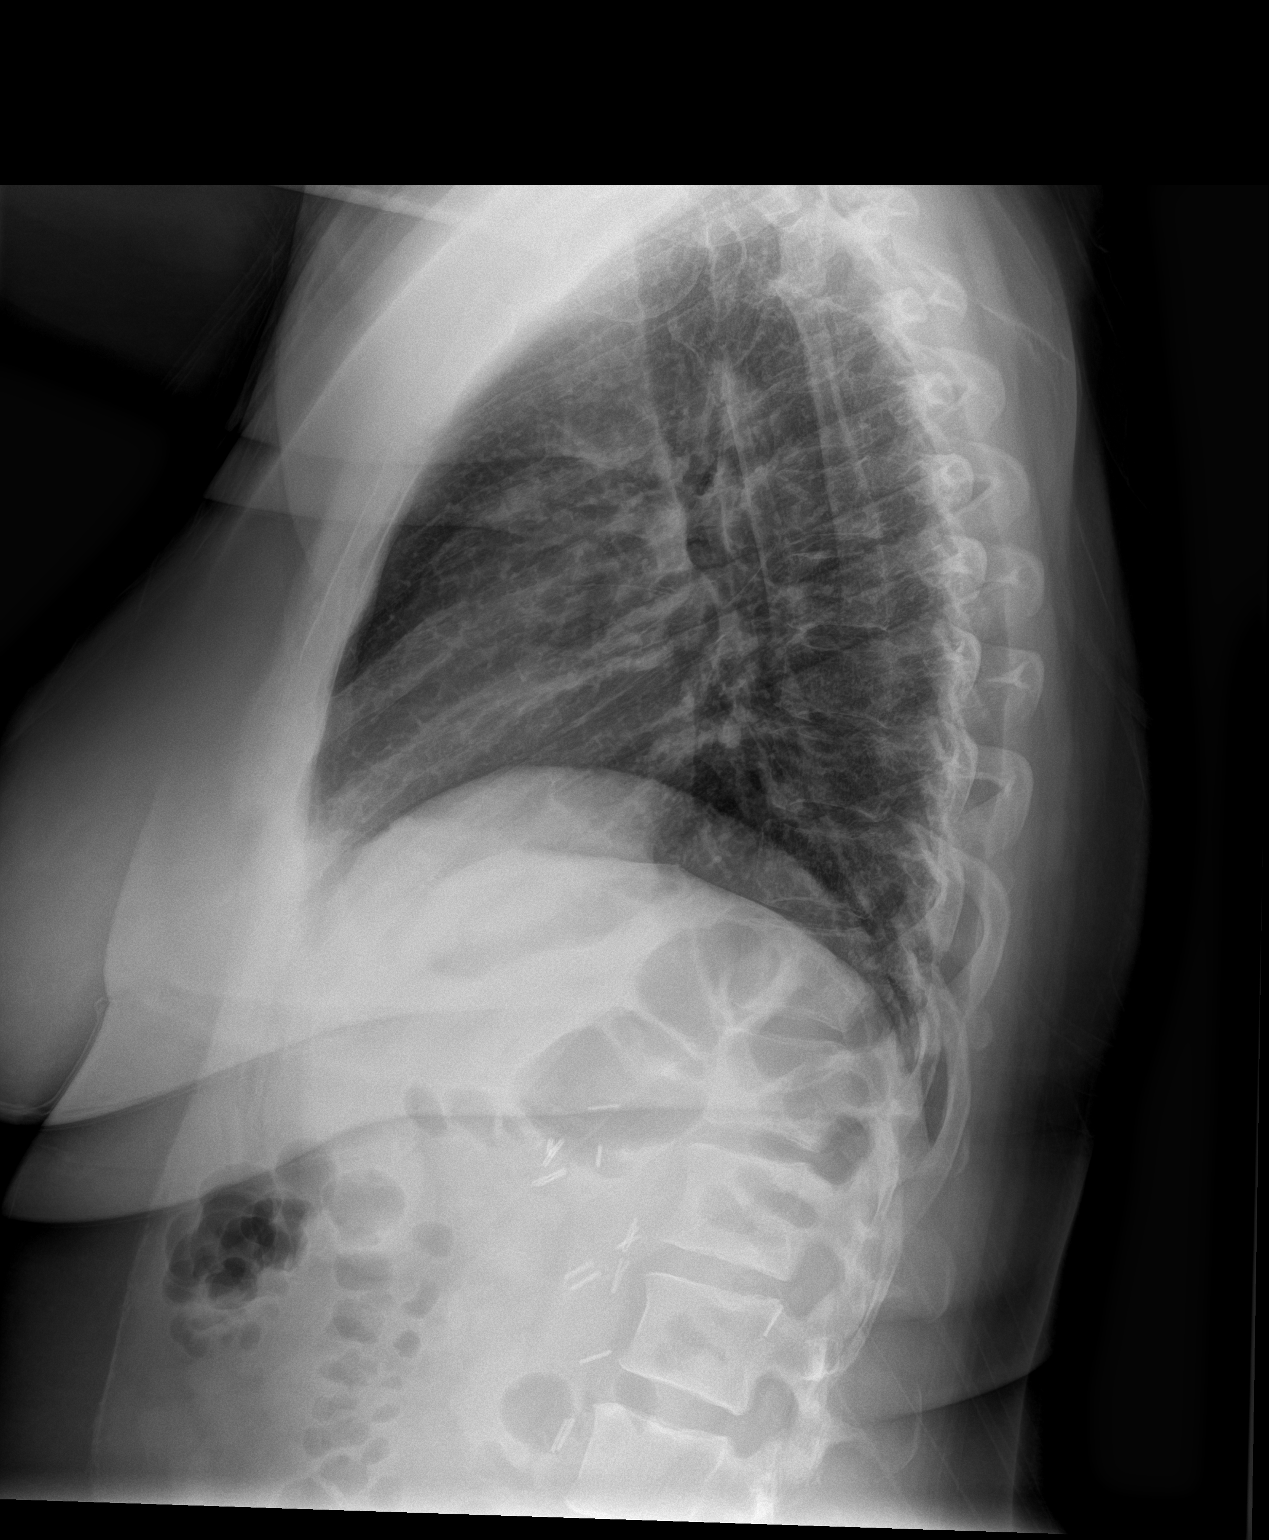

[2 of 2 positions shown; findings below may reference images not displayed]

FINDINGS: Very mild atelectasis and/or infiltrate is seen within the right
lung base. There is no evidence of a pleural effusion or
pneumothorax. The heart size and mediastinal contours are within
normal limits. The visualized skeletal structures are unremarkable.
Radiopaque surgical clips are seen within the right upper quadrant
and medial aspect of the left upper quadrant.
IMPRESSION: 1. Very mild right basilar atelectasis and/or infiltrate.

## 2020-03-05 DIAGNOSIS — Z6835 Body mass index (BMI) 35.0-35.9, adult: Secondary | ICD-10-CM | POA: Diagnosis not present

## 2020-03-05 DIAGNOSIS — R5383 Other fatigue: Secondary | ICD-10-CM | POA: Diagnosis not present

## 2020-03-05 DIAGNOSIS — F909 Attention-deficit hyperactivity disorder, unspecified type: Secondary | ICD-10-CM | POA: Diagnosis not present

## 2020-03-05 DIAGNOSIS — E6609 Other obesity due to excess calories: Secondary | ICD-10-CM | POA: Diagnosis not present

## 2020-03-05 DIAGNOSIS — N183 Chronic kidney disease, stage 3 unspecified: Secondary | ICD-10-CM | POA: Diagnosis not present

## 2020-03-05 DIAGNOSIS — K219 Gastro-esophageal reflux disease without esophagitis: Secondary | ICD-10-CM | POA: Diagnosis not present

## 2020-04-09 DIAGNOSIS — D250 Submucous leiomyoma of uterus: Secondary | ICD-10-CM | POA: Diagnosis not present

## 2020-04-16 DIAGNOSIS — D252 Subserosal leiomyoma of uterus: Secondary | ICD-10-CM | POA: Diagnosis not present

## 2020-04-22 DIAGNOSIS — Z01818 Encounter for other preprocedural examination: Secondary | ICD-10-CM | POA: Diagnosis not present

## 2020-04-22 DIAGNOSIS — Z01812 Encounter for preprocedural laboratory examination: Secondary | ICD-10-CM | POA: Diagnosis not present

## 2020-04-22 DIAGNOSIS — Z20822 Contact with and (suspected) exposure to covid-19: Secondary | ICD-10-CM | POA: Diagnosis not present

## 2020-04-22 DIAGNOSIS — Z01811 Encounter for preprocedural respiratory examination: Secondary | ICD-10-CM | POA: Diagnosis not present

## 2020-04-22 DIAGNOSIS — N920 Excessive and frequent menstruation with regular cycle: Secondary | ICD-10-CM | POA: Diagnosis not present

## 2020-04-24 DIAGNOSIS — N946 Dysmenorrhea, unspecified: Secondary | ICD-10-CM | POA: Diagnosis not present

## 2020-04-24 DIAGNOSIS — N8302 Follicular cyst of left ovary: Secondary | ICD-10-CM | POA: Diagnosis not present

## 2020-04-24 DIAGNOSIS — N920 Excessive and frequent menstruation with regular cycle: Secondary | ICD-10-CM | POA: Diagnosis not present

## 2020-04-24 DIAGNOSIS — N8301 Follicular cyst of right ovary: Secondary | ICD-10-CM | POA: Diagnosis not present

## 2020-04-24 DIAGNOSIS — D259 Leiomyoma of uterus, unspecified: Secondary | ICD-10-CM | POA: Diagnosis not present

## 2020-05-09 DIAGNOSIS — F909 Attention-deficit hyperactivity disorder, unspecified type: Secondary | ICD-10-CM | POA: Diagnosis not present

## 2020-05-09 DIAGNOSIS — N803 Endometriosis of pelvic peritoneum: Secondary | ICD-10-CM | POA: Diagnosis not present

## 2020-05-09 DIAGNOSIS — Z90711 Acquired absence of uterus with remaining cervical stump: Secondary | ICD-10-CM | POA: Diagnosis not present

## 2020-05-10 ENCOUNTER — Other Ambulatory Visit: Payer: Self-pay | Admitting: Nurse Practitioner

## 2020-05-10 DIAGNOSIS — K219 Gastro-esophageal reflux disease without esophagitis: Secondary | ICD-10-CM

## 2020-05-20 ENCOUNTER — Encounter: Payer: Self-pay | Admitting: *Deleted

## 2020-11-19 DIAGNOSIS — Z1331 Encounter for screening for depression: Secondary | ICD-10-CM | POA: Diagnosis not present

## 2020-11-19 DIAGNOSIS — G4700 Insomnia, unspecified: Secondary | ICD-10-CM | POA: Diagnosis not present

## 2020-11-19 DIAGNOSIS — Z6836 Body mass index (BMI) 36.0-36.9, adult: Secondary | ICD-10-CM | POA: Diagnosis not present

## 2020-11-19 DIAGNOSIS — K219 Gastro-esophageal reflux disease without esophagitis: Secondary | ICD-10-CM | POA: Diagnosis not present

## 2020-11-19 DIAGNOSIS — F909 Attention-deficit hyperactivity disorder, unspecified type: Secondary | ICD-10-CM | POA: Diagnosis not present

## 2021-05-01 DIAGNOSIS — K219 Gastro-esophageal reflux disease without esophagitis: Secondary | ICD-10-CM | POA: Diagnosis not present

## 2021-05-01 DIAGNOSIS — E782 Mixed hyperlipidemia: Secondary | ICD-10-CM | POA: Diagnosis not present

## 2021-05-01 DIAGNOSIS — Z6841 Body Mass Index (BMI) 40.0 and over, adult: Secondary | ICD-10-CM | POA: Diagnosis not present

## 2021-05-01 DIAGNOSIS — L309 Dermatitis, unspecified: Secondary | ICD-10-CM | POA: Diagnosis not present

## 2021-05-01 DIAGNOSIS — Z0001 Encounter for general adult medical examination with abnormal findings: Secondary | ICD-10-CM | POA: Diagnosis not present

## 2021-10-05 DIAGNOSIS — J019 Acute sinusitis, unspecified: Secondary | ICD-10-CM | POA: Diagnosis not present

## 2021-10-05 DIAGNOSIS — H6691 Otitis media, unspecified, right ear: Secondary | ICD-10-CM | POA: Diagnosis not present

## 2022-03-23 ENCOUNTER — Ambulatory Visit
Admission: EM | Admit: 2022-03-23 | Discharge: 2022-03-23 | Disposition: A | Discharge status: Home / Self Care | Payer: BC Managed Care – PPO | Attending: Family Medicine | Admitting: Family Medicine

## 2022-03-23 ENCOUNTER — Ambulatory Visit: Payer: BC Managed Care – PPO

## 2022-03-23 DIAGNOSIS — J069 Acute upper respiratory infection, unspecified: Secondary | ICD-10-CM | POA: Diagnosis not present

## 2022-03-23 LAB — POCT RAPID STREP A (OFFICE): Rapid Strep A Screen: NEGATIVE

## 2022-03-23 MED ORDER — ALBUTEROL SULFATE HFA 108 (90 BASE) MCG/ACT IN AERS: 1.0000 | INHALATION_SPRAY | Freq: Four times a day (QID) | RESPIRATORY_TRACT | 0 refills | Status: DC | PRN

## 2022-03-23 MED ORDER — PROMETHAZINE-DM 6.25-15 MG/5ML PO SYRP: 5.0000 mL | ORAL_SOLUTION | Freq: Four times a day (QID) | ORAL | 0 refills | Status: DC | PRN

## 2022-03-23 NOTE — ED Triage Notes (Signed)
Pt presents with c/o sore throat , cough nasal congestion and body aches since Friday

## 2022-03-23 NOTE — ED Provider Notes (Signed)
[facility]    CSN: [id number] Arrival date & time: 03/23/22  1237      History   Chief Complaint Chief Complaint  Patient presents with   Sore Throat    Entered by patient   Cough   Nasal Congestion    HPI Kaylee Contreras is a 41 y.o. female.   Presenting today with 3 to 4-day history of sore throat, cough, congestion, body aches, chills, fever.  Denies chest pain, shortness of breath, abdominal pain, nausea vomiting or diarrhea.  So far taking over-the-counter congestion medication with minimal relief.  No known pertinent chronic medical problems.    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      Home Medications    Prior to Admission medications   Medication Sig Start Date End Date Taking? Authorizing Provider  albuterol (VENTOLIN HFA) 108 (90 Base) MCG/ACT inhaler Inhale 1-2 puffs into the lungs every 6 (six) hours as needed for wheezing or shortness of breath. 03/23/22  Yes Particia Nearing, PA-C  promethazine-dextromethorphan (PROMETHAZINE-DM) 6.25-15 MG/5ML syrup Take 5 mLs by mouth 4 (four) times daily as needed. 03/23/22  Yes Particia Nearing, PA-C  amphetamine-dextroamphetamine (ADDERALL) 10 MG tablet Take 10 mg by mouth daily as needed (attention).  12/27/18   [provider]  DEXILANT 60 MG capsule TAKE 1 CAPSULE BY MOUTH    DAILY BEFORE BREAKFAST 05/10/20   Anice Paganini, NP  eletriptan (RELPAX) 20 MG tablet Take 20 mg by mouth daily as needed for migraine.  12/13/18   [provider]  Galcanezumab-gnlm (EMGALITY) 120 MG/ML SOAJ Inject 120 mg into the skin every 30 (thirty) days. 05/16/19   Penumalli, Glenford Bayley, MD  sucralfate (CARAFATE) 1 g tablet Take 1 g by mouth 4 (four) times daily as needed (acid reflux symptoms).  01/03/19   [provider]    Family History Family History  Problem Relation Age of Onset   Renal Disease Father        FSGS   High Cholesterol Mother    Dementia Maternal Grandmother    Lung disease Paternal Grandfather    Colon cancer Neg Hx     Social History Social History   Tobacco Use   Smoking status: Former    Types: Cigarettes    Quit date: 01/03/2009    Years since quitting: 13.2   Smokeless tobacco: Never  Substance Use Topics   Alcohol use: Yes    Comment: socially   Drug use: Never     Allergies   Iodinated contrast media, Nsaids, Other, and Prednisone   Review of Systems Review of Systems Per HPI  Physical Exam Triage Vital Signs ED Triage Vitals  Enc Vitals Group     BP 03/23/22 1248 120/72     Pulse Rate 03/23/22 1248 (!) 113     Resp 03/23/22 1248 16     Temp 03/23/22 1248 99.4 F (37.4 C)     Temp Source 03/23/22 1248 Oral     SpO2 03/23/22 1248 95 %     Weight --      Height --      Head Circumference --      Peak Flow --      Pain Score 03/23/22 1251 6     Pain Loc --      Pain Edu? --      Excl. in GC? --    No data found.  Updated Vital Signs BP 120/72 (BP Location: Right Arm)   Pulse (!) 113   Temp 99.4 F (37.4 C) (Oral)   Resp 16   SpO2 95%   Visual Acuity Right Eye Distance:   Left Eye Distance:   Bilateral Distance:    Right Eye Near:   Left Eye Near:    Bilateral Near:     Physical Exam Vitals and nursing note reviewed.   Constitutional:      Appearance: Normal appearance. She is not ill-appearing.  HENT:     Head: Atraumatic.     Right Ear: Tympanic membrane and external ear normal.     Left Ear: Tympanic membrane and external ear normal.     Nose: Rhinorrhea present.     Mouth/Throat:     Mouth: Mucous membranes are moist.     Pharynx: Posterior oropharyngeal erythema present. No oropharyngeal exudate.     Comments: Uvula midline, oral airway patent Eyes:     Extraocular Movements: Extraocular movements intact.     Conjunctiva/sclera: Conjunctivae normal.  Cardiovascular:     Rate and Rhythm: Normal rate and regular rhythm.     Heart sounds: Normal heart sounds.  Pulmonary:     Effort: Pulmonary effort is normal.     Breath sounds: Normal breath sounds. No wheezing or rales.  Musculoskeletal:        General: Normal range of motion.     Cervical back: Normal range of motion and neck supple.  Lymphadenopathy:     Cervical: No cervical adenopathy.  Skin:    General: Skin is warm and dry.  Neurological:     Mental Status: She is alert and oriented to person, place, and time.  Psychiatric:        Mood and Affect: Mood normal.        Thought Content: Thought content normal.        Judgment: Judgment normal.      UC Treatments / Results  Labs (all labs ordered are listed, but only abnormal results are displayed) Labs Reviewed  COVID-19, FLU A+B NAA  CULTURE, GROUP A STREP [facility]  POCT RAPID STREP A (OFFICE)    EKG   Radiology No results found.  Procedures Procedures (including critical care time)  Medications Ordered in UC Medications - No data to display  Initial Impression / Assessment and Plan / UC Course  I have reviewed the triage vital signs and the nursing notes.  Pertinent labs & imaging results that were available during my care of the patient were reviewed by me and considered in my medical decision making (see chart for details).     Mildly tachycardic in  triage, otherwise vital signs reassuring.  Rapid strep negative, throat culture and COVID and  flu testing pending.  Suspect COVID based on symptoms.  Treat with Phenergan DM, albuterol inhaler, supportive over-the-counter medications and home care.  Return for worsening symptoms.  Final Clinical Impressions(s) / UC Diagnoses   Final diagnoses:  Viral URI with cough   Discharge Instructions   None    ED Prescriptions     Medication Sig Dispense Auth. Provider   promethazine-dextromethorphan (PROMETHAZINE-DM) 6.25-15 MG/5ML syrup Take 5 mLs by mouth 4 (four) times daily as needed. 100 mL Particia Nearing, PA-C   albuterol (VENTOLIN HFA) 108 (90 Base) MCG/ACT inhaler Inhale 1-2 puffs into the lungs every 6 (six) hours as needed for wheezing or shortness of breath. 18 g Particia Nearing, [place]      PDMP not reviewed this encounter.   Particia Nearing, [place] 03/23/22 1345

## 2022-03-24 LAB — COVID-19, FLU A+B NAA
Influenza A, NAA: NOT DETECTED
Influenza B, NAA: NOT DETECTED
SARS-CoV-2, NAA: DETECTED — AB

## 2022-03-26 LAB — CULTURE, GROUP A STREP (THRC)

## 2022-04-20 ENCOUNTER — Ambulatory Visit
Admission: EM | Admit: 2022-04-20 | Discharge: 2022-04-20 | Disposition: A | Discharge status: Home / Self Care | Payer: BC Managed Care – PPO | Attending: Nurse Practitioner | Admitting: Nurse Practitioner

## 2022-04-20 DIAGNOSIS — H9201 Otalgia, right ear: Secondary | ICD-10-CM

## 2022-04-20 DIAGNOSIS — H66002 Acute suppurative otitis media without spontaneous rupture of ear drum, left ear: Secondary | ICD-10-CM | POA: Diagnosis not present

## 2022-04-20 MED ORDER — AMOXICILLIN-POT CLAVULANATE 875-125 MG PO TABS: 1.0000 | ORAL_TABLET | Freq: Two times a day (BID) | ORAL | 0 refills | Status: AC

## 2022-04-20 NOTE — Discharge Instructions (Addendum)
-   Please start on the Augmentin for the ear infection.   - If pain is no better after treatment, follow up with ENT

## 2022-04-20 NOTE — ED Provider Notes (Signed)
[facility]    CSN: [id number] Arrival date & time: 04/20/22  1011      History   Chief Complaint Chief Complaint  Patient presents with   Ear Fullness    Earache - Entered by patient    HPI CATALEIA GADE is a 41 y.o. female.   Patient presents with bilateral ear fullness ongoing for the past month.  Reports she has had it since she was diagnosed with COVID-19 about 1 month ago.  Also endorses sharp pain in the right ear that began the past couple of days.  She denies fever, body aches, chills, drainage from the ear, itching in the ear, and hearing loss.  She describes a fullness sensation that feels like she is in a balloon or underwater.  Reports COVID-19 symptoms have improved.  Denies Q-tip use.  Has taken over-the-counter Sudafed, Flonase, oral antihistamine without relief.  Patient reports she has been on amoxicillin in the past 90 days.  Denies other antibiotic use.    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      Home Medications    Prior to Admission medications   Medication Sig Start Date End Date Taking? Authorizing Provider  amoxicillin-clavulanate (AUGMENTIN) 875-125 MG tablet Take 1 tablet by mouth 2 (two) times daily for 7 days. 04/20/22 04/27/22 Yes Valentino Nose, NP   albuterol (VENTOLIN HFA) 108 (90 Base) MCG/ACT inhaler Inhale 1-2 puffs into the lungs every 6 (six) hours as needed for wheezing or shortness of breath. 03/23/22   Particia Nearing, PA-C  amphetamine-dextroamphetamine (ADDERALL) 10 MG tablet Take 10 mg by mouth daily as needed (attention).  12/27/18   [provider]  DEXILANT 60 MG capsule TAKE 1 CAPSULE BY MOUTH    DAILY BEFORE BREAKFAST 05/10/20   Anice Paganini, NP  eletriptan (RELPAX) 20 MG tablet Take 20 mg by mouth daily as needed for migraine.  12/13/18   [provider]  Galcanezumab-gnlm (EMGALITY) 120 MG/ML SOAJ Inject 120 mg into the skin every 30 (thirty) days. 05/16/19   Penumalli, Glenford Bayley, MD  promethazine-dextromethorphan (PROMETHAZINE-DM) 6.25-15 MG/5ML syrup Take 5 mLs by mouth 4 (four) times daily as needed. 03/23/22   Particia Nearing, PA-C  sucralfate (CARAFATE) 1 g tablet Take 1 g by mouth 4 (four) times daily as needed (acid reflux symptoms).  01/03/19   [provider]    Family History Family History  Problem Relation Age of Onset   Renal Disease Father        FSGS   High Cholesterol Mother    Dementia Maternal Grandmother    Lung disease Paternal Grandfather    Colon cancer Neg Hx  Social History Social History   Tobacco Use   Smoking status: Former    Types: Cigarettes    Quit date: 01/03/2009    Years since quitting: 13.3   Smokeless tobacco: Never  Substance Use Topics   Alcohol use: Yes    Comment: socially   Drug use: Never     Allergies   Iodinated contrast media, Nsaids, Other, and Prednisone   Review of Systems Review of Systems Per HPI  Physical Exam Triage Vital Signs ED Triage Vitals  Enc Vitals Group     BP 04/20/22 1024 101/72     Pulse Rate 04/20/22 1024 77     Resp 04/20/22 1024 20     Temp 04/20/22 1024 98.5 F (36.9 C)     Temp src --      SpO2 04/20/22 1024 97 %     Weight --      Height --      Head Circumference --      Peak Flow  --      Pain Score 04/20/22 1022 7     Pain Loc --      Pain Edu? --      Excl. in GC? --    No data found.  Updated Vital Signs BP 101/72   Pulse 77   Temp 98.5 F (36.9 C)   Resp 20   SpO2 97%   Visual Acuity Right Eye Distance:   Left Eye Distance:   Bilateral Distance:    Right Eye Near:   Left Eye Near:    Bilateral Near:     Physical Exam Vitals and nursing note reviewed.  Constitutional:      General: She is not in acute distress.    Appearance: Normal appearance. She is not toxic-appearing.  HENT:     Head: Normocephalic and atraumatic.     Right Ear: No drainage, swelling or tenderness. A middle ear effusion is present. Tympanic membrane is not perforated, erythematous or bulging.     Left Ear: No drainage, swelling or tenderness. A middle ear effusion is present. Tympanic membrane is erythematous. Tympanic membrane is not perforated or bulging.     Mouth/Throat:     Mouth: Mucous membranes are moist.     Pharynx: Oropharynx is clear. No oropharyngeal exudate or posterior oropharyngeal erythema.  Eyes:     General: No scleral icterus.    Extraocular Movements: Extraocular movements intact.  Pulmonary:     Effort: Pulmonary effort is normal. No respiratory distress.  Musculoskeletal:     Cervical back: Normal range of motion.  Lymphadenopathy:     Cervical: No cervical adenopathy.  Skin:    General: Skin is warm and dry.     Capillary Refill: Capillary refill takes less than 2 seconds.     Coloration: Skin is not jaundiced or pale.     Findings: No erythema.  Neurological:     Mental Status: She is alert and oriented to person, place, and time.  Psychiatric:        Behavior: Behavior is cooperative.      UC Treatments / Results  Labs (all labs ordered are listed, but only abnormal results are displayed) Labs Reviewed - No data to display  EKG   Radiology No results found.  Procedures Procedures (including critical care  time)  Medications Ordered in UC Medications - No data to display  Initial Impression / Assessment and Plan / UC Course  I have reviewed the triage vital signs  and the nursing notes.  Pertinent labs & imaging results that were available during my care of the patient were reviewed by me and considered in my medical decision making (see chart for details).    Patient is a very pleasant, well-appearing 41 year old female presenting for bilateral ear fullness and right otalgia today.  In triage, vital signs are stable, she is afebrile, not tachycardic, oxygenating well on room air.  Examination is consistent with otitis media of the left ear and a right ear effusion.  Treat with Augmentin twice daily for 5 days.  Supportive care discussed.  Follow-up with ENT if pain and ear is no better after full treatment of Augmentin.  The patient was given the opportunity to ask questions.  All questions answered to their satisfaction.  The patient is in agreement to this plan.   Final Clinical Impressions(s) / UC Diagnoses   Final diagnoses:  Non-recurrent acute suppurative otitis media of left ear without spontaneous rupture of tympanic membrane  Right ear pain     Discharge Instructions      - Please start on the Augmentin for the ear infection.   - If pain is no better after treatment, follow up with ENT     ED Prescriptions     Medication Sig Dispense Auth. Provider   amoxicillin-clavulanate (AUGMENTIN) 875-125 MG tablet Take 1 tablet by mouth 2 (two) times daily for 7 days. 14 tablet Valentino Nose, NP      PDMP not reviewed this encounter.   Valentino Nose, NP 04/20/22 1141

## 2022-04-20 NOTE — ED Triage Notes (Signed)
Pt presents with c/o ear fullness for past month , now has pain in right ear

## 2022-05-13 DIAGNOSIS — H6981 Other specified disorders of Eustachian tube, right ear: Secondary | ICD-10-CM | POA: Diagnosis not present

## 2022-05-13 DIAGNOSIS — H6991 Unspecified Eustachian tube disorder, right ear: Secondary | ICD-10-CM | POA: Insufficient documentation

## 2022-05-13 DIAGNOSIS — H6501 Acute serous otitis media, right ear: Secondary | ICD-10-CM | POA: Diagnosis not present

## 2022-06-30 DIAGNOSIS — E039 Hypothyroidism, unspecified: Secondary | ICD-10-CM | POA: Diagnosis not present

## 2022-06-30 DIAGNOSIS — I341 Nonrheumatic mitral (valve) prolapse: Secondary | ICD-10-CM | POA: Diagnosis not present

## 2022-06-30 DIAGNOSIS — K219 Gastro-esophageal reflux disease without esophagitis: Secondary | ICD-10-CM | POA: Diagnosis not present

## 2022-06-30 DIAGNOSIS — D518 Other vitamin B12 deficiency anemias: Secondary | ICD-10-CM | POA: Diagnosis not present

## 2022-06-30 DIAGNOSIS — R5383 Other fatigue: Secondary | ICD-10-CM | POA: Diagnosis not present

## 2022-06-30 DIAGNOSIS — Z1331 Encounter for screening for depression: Secondary | ICD-10-CM | POA: Diagnosis not present

## 2022-06-30 DIAGNOSIS — Z23 Encounter for immunization: Secondary | ICD-10-CM | POA: Diagnosis not present

## 2022-06-30 DIAGNOSIS — E6609 Other obesity due to excess calories: Secondary | ICD-10-CM | POA: Diagnosis not present

## 2022-06-30 DIAGNOSIS — Z0001 Encounter for general adult medical examination with abnormal findings: Secondary | ICD-10-CM | POA: Diagnosis not present

## 2022-06-30 DIAGNOSIS — N1831 Chronic kidney disease, stage 3a: Secondary | ICD-10-CM | POA: Diagnosis not present

## 2022-06-30 DIAGNOSIS — Z1389 Encounter for screening for other disorder: Secondary | ICD-10-CM | POA: Diagnosis not present

## 2022-06-30 DIAGNOSIS — Z6838 Body mass index (BMI) 38.0-38.9, adult: Secondary | ICD-10-CM | POA: Diagnosis not present

## 2024-01-19 DIAGNOSIS — G479 Sleep disorder, unspecified: Secondary | ICD-10-CM | POA: Insufficient documentation

## 2024-01-19 DIAGNOSIS — R232 Flushing: Secondary | ICD-10-CM | POA: Insufficient documentation

## 2024-01-19 DIAGNOSIS — R4189 Other symptoms and signs involving cognitive functions and awareness: Secondary | ICD-10-CM | POA: Insufficient documentation

## 2024-01-19 DIAGNOSIS — E8940 Asymptomatic postprocedural ovarian failure: Secondary | ICD-10-CM | POA: Insufficient documentation

## 2024-01-19 DIAGNOSIS — R6882 Decreased libido: Secondary | ICD-10-CM | POA: Insufficient documentation

## 2024-02-17 ENCOUNTER — Other Ambulatory Visit (HOSPITAL_COMMUNITY): Payer: Self-pay | Admitting: Internal Medicine

## 2024-02-17 ENCOUNTER — Encounter (HOSPITAL_COMMUNITY): Payer: Self-pay | Admitting: Family

## 2024-02-17 DIAGNOSIS — Z1231 Encounter for screening mammogram for malignant neoplasm of breast: Secondary | ICD-10-CM

## 2024-02-24 ENCOUNTER — Ambulatory Visit (HOSPITAL_COMMUNITY)

## 2024-03-06 ENCOUNTER — Ambulatory Visit (HOSPITAL_COMMUNITY)
Admission: RE | Admit: 2024-03-06 | Discharge: 2024-03-06 | Disposition: A | Discharge status: Home / Self Care | Payer: Self-pay | Source: Ambulatory Visit | Attending: Internal Medicine | Admitting: Internal Medicine

## 2024-03-06 DIAGNOSIS — Z1231 Encounter for screening mammogram for malignant neoplasm of breast: Secondary | ICD-10-CM | POA: Insufficient documentation

## 2024-03-19 ENCOUNTER — Other Ambulatory Visit: Payer: Self-pay | Admitting: Medical Genetics

## 2024-03-22 ENCOUNTER — Other Ambulatory Visit (HOSPITAL_COMMUNITY): Payer: Self-pay | Admitting: Internal Medicine

## 2024-03-22 DIAGNOSIS — R928 Other abnormal and inconclusive findings on diagnostic imaging of breast: Secondary | ICD-10-CM

## 2024-04-04 ENCOUNTER — Ambulatory Visit (HOSPITAL_COMMUNITY)
Admission: RE | Admit: 2024-04-04 | Discharge: 2024-04-04 | Disposition: A | Discharge status: Home / Self Care | Source: Ambulatory Visit | Attending: Internal Medicine | Admitting: Internal Medicine

## 2024-04-04 DIAGNOSIS — R928 Other abnormal and inconclusive findings on diagnostic imaging of breast: Secondary | ICD-10-CM

## 2024-04-16 ENCOUNTER — Ambulatory Visit: Admission: EM | Admit: 2024-04-16 | Discharge: 2024-04-16 | Disposition: A | Discharge status: Home / Self Care

## 2024-04-16 ENCOUNTER — Other Ambulatory Visit: Payer: Self-pay

## 2024-04-16 ENCOUNTER — Emergency Department (HOSPITAL_COMMUNITY)

## 2024-04-16 ENCOUNTER — Encounter: Payer: Self-pay | Admitting: Emergency Medicine

## 2024-04-16 ENCOUNTER — Emergency Department (HOSPITAL_COMMUNITY)
Admission: EM | Admit: 2024-04-16 | Discharge: 2024-04-16 | Disposition: A | Discharge status: Home / Self Care | Source: Intra-hospital | Attending: Emergency Medicine | Admitting: Emergency Medicine

## 2024-04-16 ENCOUNTER — Encounter (HOSPITAL_COMMUNITY): Payer: Self-pay

## 2024-04-16 DIAGNOSIS — G43909 Migraine, unspecified, not intractable, without status migrainosus: Secondary | ICD-10-CM | POA: Diagnosis not present

## 2024-04-16 DIAGNOSIS — J9601 Acute respiratory failure with hypoxia: Secondary | ICD-10-CM | POA: Diagnosis not present

## 2024-04-16 DIAGNOSIS — J181 Lobar pneumonia, unspecified organism: Secondary | ICD-10-CM | POA: Insufficient documentation

## 2024-04-16 DIAGNOSIS — R531 Weakness: Secondary | ICD-10-CM | POA: Diagnosis not present

## 2024-04-16 DIAGNOSIS — R42 Dizziness and giddiness: Secondary | ICD-10-CM

## 2024-04-16 DIAGNOSIS — R000 Tachycardia, unspecified: Secondary | ICD-10-CM | POA: Diagnosis not present

## 2024-04-16 DIAGNOSIS — I951 Orthostatic hypotension: Secondary | ICD-10-CM | POA: Diagnosis not present

## 2024-04-16 DIAGNOSIS — Z79899 Other long term (current) drug therapy: Secondary | ICD-10-CM | POA: Diagnosis not present

## 2024-04-16 DIAGNOSIS — K219 Gastro-esophageal reflux disease without esophagitis: Secondary | ICD-10-CM | POA: Diagnosis not present

## 2024-04-16 DIAGNOSIS — R7401 Elevation of levels of liver transaminase levels: Secondary | ICD-10-CM | POA: Diagnosis not present

## 2024-04-16 DIAGNOSIS — J189 Pneumonia, unspecified organism: Secondary | ICD-10-CM

## 2024-04-16 DIAGNOSIS — R0602 Shortness of breath: Secondary | ICD-10-CM | POA: Diagnosis present

## 2024-04-16 LAB — COMPREHENSIVE METABOLIC PANEL WITH GFR
ALT: 20 U/L (ref 0–44)
AST: 25 U/L (ref 15–41)
Albumin: 3.6 g/dL (ref 3.5–5.0)
Alkaline Phosphatase: 82 U/L (ref 38–126)
Anion gap: 10 (ref 5–15)
BUN: 14 mg/dL (ref 6–20)
CO2: 22 mmol/L (ref 22–32)
Calcium: 8.9 mg/dL (ref 8.9–10.3)
Chloride: 103 mmol/L (ref 98–111)
Creatinine, Ser: 1.14 mg/dL — ABNORMAL HIGH (ref 0.44–1.00)
GFR, Estimated: >60 mL/min (ref >60)
Glucose, Bld: 102 mg/dL — ABNORMAL HIGH (ref 70–99)
Potassium: 3.7 mmol/L (ref 3.5–5.1)
Sodium: 135 mmol/L (ref 135–145)
Total Bilirubin: 0.4 mg/dL (ref 0.0–1.2)
Total Protein: 7.7 g/dL (ref 6.5–8.1)

## 2024-04-16 LAB — RESP PANEL BY RT-PCR (RSV, FLU A&B, COVID)  RVPGX2
Influenza A by PCR: NEGATIVE
Influenza B by PCR: NEGATIVE
Resp Syncytial Virus by PCR: NEGATIVE
SARS Coronavirus 2 by RT PCR: NEGATIVE

## 2024-04-16 LAB — CBC WITH DIFFERENTIAL/PLATELET
Abs Immature Granulocytes: 0.02 K/uL (ref 0.00–0.07)
Basophils Absolute: 0 K/uL (ref 0.0–0.1)
Basophils Relative: 1 %
Eosinophils Absolute: 0 K/uL (ref 0.0–0.5)
Eosinophils Relative: 0 %
HCT: 41.9 % (ref 36.0–46.0)
Hemoglobin: 14 g/dL (ref 12.0–15.0)
Immature Granulocytes: 0 %
Lymphocytes Relative: 14 %
Lymphs Abs: 0.9 K/uL (ref 0.7–4.0)
MCH: 29.6 pg (ref 26.0–34.0)
MCHC: 33.4 g/dL (ref 30.0–36.0)
MCV: 88.6 fL (ref 80.0–100.0)
Monocytes Absolute: 0.4 K/uL (ref 0.1–1.0)
Monocytes Relative: 7 %
Neutro Abs: 4.7 K/uL (ref 1.7–7.7)
Neutrophils Relative %: 78 %
Platelets: 207 K/uL (ref 150–400)
RBC: 4.73 MIL/uL (ref 3.87–5.11)
RDW: 12.3 % (ref 11.5–15.5)
WBC: 6 K/uL (ref 4.0–10.5)
nRBC: 0 % (ref 0.0–0.2)

## 2024-04-16 LAB — PROTIME-INR
INR: 1.1 (ref 0.8–1.2)
Prothrombin Time: 14.3 s (ref 11.4–15.2)

## 2024-04-16 LAB — D-DIMER, QUANTITATIVE: D-Dimer, Quant: 0.58 ug{FEU}/mL — ABNORMAL HIGH (ref 0.00–0.50)

## 2024-04-16 LAB — LACTIC ACID, PLASMA: Lactic Acid, Venous: 1.1 mmol/L (ref 0.5–1.9)

## 2024-04-16 LAB — POC SOFIA SARS ANTIGEN FIA: SARS Coronavirus 2 Ag: NEGATIVE

## 2024-04-16 MED ORDER — SODIUM CHLORIDE 0.9 % IV BOLUS
1000.0000 mL | Freq: Once | INTRAVENOUS | Status: AC
  Administered 2024-04-16: 1000 mL via INTRAVENOUS

## 2024-04-16 MED ORDER — BENZONATATE 100 MG PO CAPS: 100.0000 mg | ORAL_CAPSULE | Freq: Three times a day (TID) | ORAL | 0 refills | Status: DC

## 2024-04-16 MED ORDER — ACETAMINOPHEN 500 MG PO TABS
1000.0000 mg | ORAL_TABLET | Freq: Once | ORAL | Status: AC
  Administered 2024-04-16: 1000 mg via ORAL
  Filled 2024-04-16: qty 2

## 2024-04-16 MED ORDER — AMOXICILLIN-POT CLAVULANATE 875-125 MG PO TABS
1.0000 | ORAL_TABLET | Freq: Once | ORAL | Status: AC
  Administered 2024-04-16: 1 via ORAL
  Filled 2024-04-16: qty 1

## 2024-04-16 MED ORDER — BENZONATATE 100 MG PO CAPS
200.0000 mg | ORAL_CAPSULE | Freq: Once | ORAL | Status: AC
  Administered 2024-04-16: 200 mg via ORAL
  Filled 2024-04-16: qty 2

## 2024-04-16 MED ORDER — AMOXICILLIN-POT CLAVULANATE 875-125 MG PO TABS: 1.0000 | ORAL_TABLET | Freq: Two times a day (BID) | ORAL | 0 refills | Status: DC

## 2024-04-16 MED ORDER — ONDANSETRON HCL 4 MG/2ML IJ SOLN
4.0000 mg | Freq: Once | INTRAMUSCULAR | Status: AC
  Administered 2024-04-16: 4 mg via INTRAVENOUS

## 2024-04-16 MED ORDER — IBUPROFEN 400 MG PO TABS
600.0000 mg | ORAL_TABLET | Freq: Once | ORAL | Status: AC
  Administered 2024-04-16: 600 mg via ORAL
  Filled 2024-04-16: qty 2

## 2024-04-16 NOTE — ED Notes (Signed)
 Patient is being discharged from the Urgent Care and sent to the Emergency Department via private vehicle . Per PA, patient is in need of higher level of care due to low BP when standing. Patient is aware and verbalizes understanding of plan of care.  Vitals:   04/16/24 1011 04/16/24 1018  BP: (!) 73/53 115/81  Pulse: (!) 103   Resp: 18   Temp: 99.5 F (37.5 C)   SpO2: 95%

## 2024-04-16 NOTE — ED Notes (Signed)
 Pt returned from radiology.

## 2024-04-16 NOTE — Discharge Instructions (Signed)
 Your testing has been reassuring The xray shows that you have a pneumonia in the right lung -  Thankfully all the other tests have been reassuring -  Please take Augmentin  twice daily for - 7 days to treat the pneumonia Tessalon  for the cough, Dayquil / Nyquil as needed Please have your family doctor recheck in you 3 days  If you are feeling worse - return to the ER.  Please take Augmentin , 1 tablet twice a day for the next 7 days, this is used to treat infections, it treats a variety of infections including animal bites, bacterial infections, and some gastrointestinal infections.   It is related to amoxicillin  so do not take this medication if you are allergic to amoxicillin  or penicillin.  effects of medications such as antibiotics include diarrhea which may occur as well as potentially inactivating birth control so if you are using a birth control pill please use an alternative form of birth control for the next 2 weeks.  There is occasions where this antibiotic does not work so if you are not improving within 48 hours you will need to be reevaluated immediately by your doctor or in the emergency department if your symptoms are worsening  Tessalon  is a cough medication that helps reduce the amount of coughing that you are having.  You may take up to 200 mg every 8 hours as needed.  This can be used safely with most or other over-the-counter medications but talk to your pharmacist before taking anything else over-the-counter with it  Thank you for allowing us  to treat you in the emergency department today.  After reviewing your examination and potential testing that was done it appears that you are safe to go home.  I would like for you to follow-up with your doctor within the next several days, have them obtain your records and follow-up with them to review all potential tests and results from your visit.  If you should develop severe or worsening symptoms return to the emergency department  immediately

## 2024-04-16 NOTE — ED Triage Notes (Signed)
 Pt seen at [facility] for weakness and cough. UC sent her here due to orthostatic Bps and her heart rate elevation. COVID was negative but UC was concerned for possible sepsis. Been feeling back since Thursday

## 2024-04-16 NOTE — ED Notes (Signed)
 Patient transported to X-ray

## 2024-04-16 NOTE — ED Provider Notes (Signed)
 [facility]    CSN: [id number] Arrival date & time: 04/16/24  0930      History   Chief Complaint No chief complaint on file.   HPI Kaylee Contreras is a 43 y.o. female.   Patient presents today with a weeklong history of URI symptoms that have worsened in the past few days.  She reports cough, body aches, subjective fever, generalized weakness.  She does report lightheadedness particularly with standing or changing positions.  She has been taking Tylenol , ibuprofen , Sudafed, cold and flu medication with temporary proven symptoms.  Denies any known sick contacts.  She has had COVID with last episode several years ago.  She denies any recent antibiotics or steroids.  Denies history of diabetes.  She does not smoke and denies history of asthma.  She is having difficulty with her daily duties as result of symptoms.      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      Home Medications    Prior to Admission medications   Medication Sig Start Date End Date Taking? Authorizing Provider  estrogens, conjugated, (PREMARIN) 0.3 MG tablet Take 0.3 mg by mouth daily. Take daily for 21 days then do not take for 7 days.   Yes [provider]  semaglutide-weight management (WEGOVY) 0.25 MG/0.5ML  SOAJ SQ injection Inject 0.25 mg into the skin.   Yes [provider]  testosterone (ANDROGEL) 50 MG/5GM (1%) GEL Place 5 g onto the skin daily.   Yes [provider]  DEXILANT  60 MG capsule TAKE 1 CAPSULE BY MOUTH    DAILY BEFORE BREAKFAST 05/10/20   Marvis Camellia LABOR, NP  eletriptan (RELPAX) 20 MG tablet Take 20 mg by mouth daily as needed for migraine.  12/13/18   [provider]  sucralfate  (CARAFATE ) 1 g tablet Take 1 g by mouth 4 (four) times daily as needed (acid reflux symptoms).  01/03/19   [provider]    Family History Family History  Problem Relation Age of Onset   Renal Disease Father        FSGS   High Cholesterol Mother    Dementia Maternal Grandmother    Lung disease Paternal Grandfather    Colon cancer Neg Hx     Social History Social History   Tobacco Use   Smoking status: Former    Current packs/day: 0.00    Types: Cigarettes    Quit date: 01/03/2009    Years since quitting: 15.2   Smokeless tobacco: Never  Substance Use Topics   Alcohol use: Yes    Comment: socially   Drug use: Never     Allergies   Iodinated contrast media, Nsaids, Other,  and Prednisone   Review of Systems Review of Systems  Constitutional:  Positive for activity change, fatigue and fever. Negative for appetite change.  HENT:  Positive for congestion and sore throat. Negative for sinus pressure and sneezing.   Respiratory:  Positive for cough. Negative for shortness of breath.   Cardiovascular:  Negative for chest pain.  Gastrointestinal:  Positive for nausea (from coughing). Negative for abdominal pain, diarrhea and vomiting.  Musculoskeletal:  Positive for arthralgias and myalgias.  Neurological:  Positive for weakness (generalized) and light-headedness. Negative for dizziness, syncope and headaches.     Physical Exam Triage Vital Signs ED Triage Vitals  Encounter Vitals Group     BP 04/16/24 1011 (!) 73/53     Girls Systolic BP Percentile --       Girls Diastolic BP Percentile --      Boys Systolic BP Percentile --      Boys Diastolic BP Percentile --      Pulse Rate 04/16/24 1011 (!) 103     Resp 04/16/24 1011 18     Temp 04/16/24 1011 99.5 F (37.5 C)     Temp Source 04/16/24 1011 Oral     SpO2 04/16/24 1011 95 %     Weight --      Height --      Head Circumference --      Peak Flow --      Pain Score 04/16/24 1015 6     Pain Loc --      Pain Education --      Exclude from Growth Chart --    Orthostatic VS for the past 24 hrs:  BP- Lying Pulse- Lying BP- Sitting Pulse- Sitting BP- Standing at 0 minutes Pulse- Standing at 0 minutes  04/16/24 1042 105/73 101 106/74 103 (!) 82/55 102    Updated Vital Signs BP 115/81 (BP Location: Right Arm)   Pulse (!) 103   Temp 99.5 F (37.5 C) (Oral)   Resp 18   LMP 11/23/2019   SpO2 95%   Visual Acuity Right Eye Distance:   Left Eye Distance:   Bilateral Distance:    Right Eye Near:   Left Eye Near:    Bilateral Near:     Physical Exam Vitals reviewed.  Constitutional:      General: She is awake. She is not in acute distress.    Appearance: Normal appearance. She is well-developed. She is ill-appearing.     Comments: Very pleasant female appears stated age laying on exam table  HENT:     Head: Normocephalic and atraumatic.     Right Ear: Ear canal and external ear normal. Tympanic membrane is scarred. Tympanic membrane is not erythematous or bulging.     Left Ear: Tympanic membrane, ear canal and external ear normal. Tympanic membrane is not erythematous or bulging.     Nose:     Right Sinus: No maxillary sinus tenderness or frontal sinus tenderness.     Left Sinus: No maxillary sinus tenderness or frontal sinus tenderness.     Mouth/Throat:     Pharynx: Uvula midline. No oropharyngeal exudate or posterior oropharyngeal erythema.  Cardiovascular:     Rate and Rhythm: Normal rate and regular rhythm.     Heart sounds: S1 normal and S2 normal. Murmur heard.   Pulmonary:     Effort: Pulmonary effort is normal.     Breath sounds: Normal breath sounds. No wheezing, rhonchi or rales.     Comments: Reactive cough  with deep breathing. Musculoskeletal:     Right lower leg: No edema.     Left lower leg: No edema.  Psychiatric:        Behavior: Behavior is cooperative.      UC Treatments / Results  Labs (all labs ordered are listed, but only abnormal results are displayed) Labs Reviewed  POC SOFIA SARS ANTIGEN FIA - Normal  POCT URINE DIPSTICK    EKG   Radiology No results found.  Procedures Procedures (including critical care time)  Medications Ordered in UC Medications - No data to display  Initial Impression / Assessment and Plan / UC Course  I have reviewed the triage vital signs and the nursing notes.  Pertinent labs & imaging results that were available during my care of the patient were reviewed by me and considered in my medical decision making (see chart for details).     Patient was initially hypotensive but this did improve after lying quietly with her feet elevated for several minutes.  She was noted to be tachycardic and orthostatic vital signs showed orthostatic hypotension.  Patient reported feeling very weak and was having difficulty ambulating was uncomfortable even going to the bathroom to attempt to get a urine.  Given severity of her symptoms as well as concern for SIRS criteria given her reported fever with tachycardia on intake we discussed that the safest thing to do is to go to the emergency room.  Patient was agreeable and will have her husband take her directly to the ER for further evaluation and management.  She was stable at time of discharge.  Final Clinical Impressions(s) / UC Diagnoses   Final diagnoses:  Generalized weakness  Tachycardia  Lightheadedness  Orthostatic hypotension     Discharge Instructions      Given how bad you are feeling and your high heart rate with low blood pressure I  recommend go to the emergency room.  Please go directly to the ER as we discussed.     ED Prescriptions   None    PDMP not reviewed this encounter.   Sherrell Rocky POUR, PA-C 04/16/24 1058

## 2024-04-16 NOTE — Discharge Instructions (Signed)
 Given how bad you are feeling and your high heart rate with low blood pressure I recommend go to the emergency room.  Please go directly to the ER as we discussed.

## 2024-04-16 NOTE — ED Notes (Signed)
 EDP made aware of fever

## 2024-04-16 NOTE — ED Triage Notes (Signed)
 Cough, body aches since Thursday.  Has been taking tylenol  and sudafed.  States gets weak when walking

## 2024-04-16 NOTE — ED Provider Notes (Signed)
 [place] EMERGENCY DEPARTMENT AT [facility] Provider Note   CSN: [id number] Arrival date & time: 04/16/24  1107     Patient presents with: Weakness   Kaylee Contreras is a 43 y.o. female.    Weakness  This patient is a 43 year old female, she has a history of acid reflux on Dexilant , headaches on Relpax and a history of taking Wegovy for weight loss, she is also on hormone replacement therapy.  She presents to the hospital with a complaint of a cough, this started as a tickle in her throat and her chest approximately 4 days ago, has gradually worsened, she is now feeling increasing nonproductive cough, some weakness and was noted to have some orthostatic hypotension at the urgent care where she went this morning.  Her COVID test was negative but because urgent care was concerned about hypotensive they sent her to the emergency department.  She has not had any objective fevers, she is not vomiting or having diarrhea, she has not had anybody sick around her.    Prior to Admission medications   Medication Sig Start Date End Date Taking? Authorizing Provider  amoxicillin -clavulanate (AUGMENTIN ) 875-125 MG tablet Take 1 tablet by mouth every 12 (twelve) hours. 04/16/24  Yes Cleotilde Rogue, MD  benzonatate  (TESSALON ) 100 MG capsule Take 1 capsule (100 mg total) by mouth every 8 (eight) hours. 04/16/24  Yes Cleotilde Rogue, MD  DEXILANT  60 MG capsule TAKE 1 CAPSULE BY MOUTH    DAILY BEFORE BREAKFAST 05/10/20   Marvis Camellia LABOR, NP  eletriptan (RELPAX) 20 MG tablet Take 20 mg by mouth daily as needed for migraine.  12/13/18   [provider]  estrogens, conjugated, (PREMARIN) 0.3 MG tablet Take 0.3 mg by mouth daily. Take daily for 21 days then do not take for 7 days.    [provider]  semaglutide-weight management (WEGOVY) 0.25 MG/0.5ML SOAJ SQ injection Inject 0.25 mg into the skin.    [provider]  sucralfate  (CARAFATE ) 1 g tablet Take 1 g by mouth 4 (four)  times daily as needed (acid reflux symptoms).  01/03/19   [provider]  testosterone (ANDROGEL) 50 MG/5GM (1%) GEL Place 5 g onto the skin daily.    [provider]    Allergies: Iodinated contrast media, Nsaids, Other, and Prednisone    Review of Systems  Neurological:  Positive for weakness.  All other systems reviewed and are negative.   Updated Vital Signs BP 110/65   Pulse (!) 114   Temp 99.8 F (37.7 C) (Oral)   Resp 20   Ht 1.575 m (5' 2)   Wt 68 kg   LMP 11/23/2019   SpO2 96%   BMI 27.44 kg/m   Physical Exam Vitals and nursing note reviewed.  Constitutional:      General: She is not in acute distress.    Appearance: She is well-developed.  HENT:     Head: Normocephalic and atraumatic.     Mouth/Throat:     Pharynx: No oropharyngeal exudate.  Eyes:     General: No scleral icterus.       Right eye: No discharge.        Left eye: No discharge.     Conjunctiva/sclera: Conjunctivae normal.     Pupils: Pupils are equal, round, and reactive to light.  Neck:     Thyroid: No thyromegaly.     Vascular: No JVD.  Cardiovascular:     Rate and Rhythm: Regular rhythm. Tachycardia present.  Heart sounds: Murmur heard.     No friction rub. No gallop.  Pulmonary:     Effort: Pulmonary effort is normal. No respiratory distress.     Breath sounds: Normal breath sounds. No wheezing or rales.     Comments: Occasional coughing but otherwise clear lung sounds Abdominal:     General: Bowel sounds are normal. There is no distension.     Palpations: Abdomen is soft. There is no mass.     Tenderness: There is no abdominal tenderness.  Musculoskeletal:        General: No tenderness. Normal range of motion.     Cervical back: Normal range of motion and neck supple.     Right lower leg: No edema.     Left lower leg: No edema.  Lymphadenopathy:     Cervical: No cervical adenopathy.  Skin:    General: Skin is warm and dry.     Findings: No erythema or  rash.  Neurological:     Mental Status: She is alert.     Coordination: Coordination normal.  Psychiatric:        Behavior: Behavior normal.     (all labs ordered are listed, but only abnormal results are displayed) Labs Reviewed  COMPREHENSIVE METABOLIC PANEL WITH GFR - Abnormal; Notable for the following components:      Result Value   Glucose, Bld 102 (*)    Creatinine, Ser 1.14 (*)    All other components within normal limits  D-DIMER, QUANTITATIVE - Abnormal; Notable for the following components:   D-Dimer, Quant 0.58 (*)    All other components within normal limits  RESP PANEL BY RT-PCR (RSV, FLU A&B, COVID)  [id number]  CULTURE, BLOOD (ROUTINE X 2)  LACTIC ACID, PLASMA  CBC WITH DIFFERENTIAL/PLATELET  PROTIME-INR    EKG: None  Radiology: DG Chest 2 View Result Date: 04/16/2024 CLINICAL DATA:  Weakness, cough, sepsis EXAM: CHEST - 2 VIEW COMPARISON:  09/05/2019 FINDINGS: Frontal and lateral views of the chest demonstrate an unremarkable cardiac silhouette. Dense airspace disease within the lateral segment of the right middle lobe consistent with pneumonia. No effusion or pneumothorax. No acute bony abnormalities. IMPRESSION: 1. Right middle lobe pneumonia. Electronically Signed   By: Ozell Daring M.D.   On: 04/16/2024 11:59     Procedures   Medications Ordered in the ED  benzonatate  (TESSALON ) capsule 200 mg (200 mg Oral Given 04/16/24 1139)  sodium chloride  0.9 % bolus 1,000 mL (0 mLs Intravenous Stopped 04/16/24 1350)  ondansetron  (ZOFRAN ) injection 4 mg (4 mg Intravenous Given 04/16/24 1209)  amoxicillin -clavulanate (AUGMENTIN ) 875-125 MG per tablet 1 tablet (1 tablet Oral Given 04/16/24 1216)  acetaminophen  (TYLENOL ) tablet 1,000 mg (1,000 mg Oral Given 04/16/24 1406)                                    Medical Decision Making Amount and/or Complexity of Data Reviewed Labs: ordered. Radiology: ordered.  Risk OTC drugs. Prescription drug management.    This  patient presents to the ED for concern of abnormal vital signs with the presence of a respiratory infection, this involves an extensive number of treatment options, and is a complaint that carries with it a high risk of complications and morbidity.  The differential diagnosis includes pneumonia, COVID, flu, sepsis, seems less likely to be a pulmonary embolism given that she is not hypoxic, oxygen is 98% on room air, heart rate  is 103-105, blood pressure is 109/74.  Will proceed with x-ray labs and some IV fluids   Co morbidities / Chronic conditions that complicate the patient evaluation  No significant chronic medical conditions   Additional history obtained:  Additional history obtained from EMR External records from outside source obtained and reviewed including notes from outpatient setting   Lab Tests:  I Ordered, and personally interpreted labs.  The pertinent results include: CBC metabolic panel unremarkable, D-dimer is borderline, the patient is otherwise not having any shortness of breath hypoxia or swelling of the legs   Imaging Studies ordered:  I ordered imaging studies including chest x-ray I independently visualized and interpreted imaging which showed right middle lobe infiltrate I agree with the radiologist interpretation   Cardiac Monitoring: / EKG:  The patient was maintained on a cardiac monitor.  I personally viewed and interpreted the cardiac monitored which showed an underlying rhythm of: Mild sinus tachycardia   Problem List / ED Course / Critical interventions / Medication management  No leukocytosis, no fever, no hypoxia, mild tachycardia, infiltrate of the lung consistent with pneumonia without sepsis I ordered medication including IV fluids and Augmentin  Reevaluation of the patient after these medicines showed that the patient improved I have reviewed the patients home medicines and have made adjustments as needed   Social Determinants of  Health:  None   Test / Admission - Considered:  Considered admission but the patient appears well, stable for discharge, informed of results and agreeable to the plan      Final diagnoses:  Pneumonia of right middle lobe due to infectious organism    ED Discharge Orders          Ordered    amoxicillin -clavulanate (AUGMENTIN ) 875-125 MG tablet  Every 12 hours        04/16/24 1412    benzonatate  (TESSALON ) 100 MG capsule  Every 8 hours        04/16/24 1412               Cleotilde Rogue, MD 04/16/24 1413

## 2024-04-18 ENCOUNTER — Other Ambulatory Visit: Payer: Self-pay

## 2024-04-18 ENCOUNTER — Encounter (HOSPITAL_COMMUNITY): Payer: Self-pay | Admitting: Emergency Medicine

## 2024-04-18 ENCOUNTER — Emergency Department (HOSPITAL_COMMUNITY)

## 2024-04-18 ENCOUNTER — Observation Stay (HOSPITAL_COMMUNITY)
Admission: EM | Admit: 2024-04-18 | Discharge: 2024-04-20 | Disposition: A | Discharge status: Home / Self Care | Attending: Internal Medicine | Admitting: Internal Medicine

## 2024-04-18 DIAGNOSIS — Z79899 Other long term (current) drug therapy: Secondary | ICD-10-CM | POA: Insufficient documentation

## 2024-04-18 DIAGNOSIS — G43909 Migraine, unspecified, not intractable, without status migrainosus: Secondary | ICD-10-CM | POA: Insufficient documentation

## 2024-04-18 DIAGNOSIS — J9601 Acute respiratory failure with hypoxia: Secondary | ICD-10-CM | POA: Insufficient documentation

## 2024-04-18 DIAGNOSIS — J189 Pneumonia, unspecified organism: Principal | ICD-10-CM | POA: Insufficient documentation

## 2024-04-18 DIAGNOSIS — K219 Gastro-esophageal reflux disease without esophagitis: Secondary | ICD-10-CM | POA: Insufficient documentation

## 2024-04-18 DIAGNOSIS — R7401 Elevation of levels of liver transaminase levels: Secondary | ICD-10-CM | POA: Insufficient documentation

## 2024-04-18 LAB — CBC WITH DIFFERENTIAL/PLATELET
Abs Immature Granulocytes: 0.02 K/uL (ref 0.00–0.07)
Basophils Absolute: 0 K/uL (ref 0.0–0.1)
Basophils Relative: 0 %
Eosinophils Absolute: 0 K/uL (ref 0.0–0.5)
Eosinophils Relative: 0 %
HCT: 38 % (ref 36.0–46.0)
Hemoglobin: 13.2 g/dL (ref 12.0–15.0)
Immature Granulocytes: 0 %
Lymphocytes Relative: 11 %
Lymphs Abs: 0.7 K/uL (ref 0.7–4.0)
MCH: 29.6 pg (ref 26.0–34.0)
MCHC: 34.7 g/dL (ref 30.0–36.0)
MCV: 85.2 fL (ref 80.0–100.0)
Monocytes Absolute: 0.3 K/uL (ref 0.1–1.0)
Monocytes Relative: 5 %
Neutro Abs: 5.2 K/uL (ref 1.7–7.7)
Neutrophils Relative %: 84 %
Platelets: 239 K/uL (ref 150–400)
RBC: 4.46 MIL/uL (ref 3.87–5.11)
RDW: 12.3 % (ref 11.5–15.5)
WBC: 6.2 K/uL (ref 4.0–10.5)
nRBC: 0 % (ref 0.0–0.2)

## 2024-04-18 LAB — COMPREHENSIVE METABOLIC PANEL WITH GFR
ALT: 60 U/L — ABNORMAL HIGH (ref 0–44)
AST: 48 U/L — ABNORMAL HIGH (ref 15–41)
Albumin: 3.3 g/dL — ABNORMAL LOW (ref 3.5–5.0)
Alkaline Phosphatase: 103 U/L (ref 38–126)
Anion gap: 13 (ref 5–15)
BUN: 11 mg/dL (ref 6–20)
CO2: 19 mmol/L — ABNORMAL LOW (ref 22–32)
Calcium: 8.8 mg/dL — ABNORMAL LOW (ref 8.9–10.3)
Chloride: 103 mmol/L (ref 98–111)
Creatinine, Ser: 1.01 mg/dL — ABNORMAL HIGH (ref 0.44–1.00)
GFR, Estimated: >60 mL/min (ref >60)
Glucose, Bld: 116 mg/dL — ABNORMAL HIGH (ref 70–99)
Potassium: 3.4 mmol/L — ABNORMAL LOW (ref 3.5–5.1)
Sodium: 135 mmol/L (ref 135–145)
Total Bilirubin: 0.6 mg/dL (ref 0.0–1.2)
Total Protein: 7.4 g/dL (ref 6.5–8.1)

## 2024-04-18 LAB — LACTIC ACID, PLASMA: Lactic Acid, Venous: 1.5 mmol/L (ref 0.5–1.9)

## 2024-04-18 NOTE — ED Triage Notes (Signed)
 Pt via POV c/o returned symptoms treated here on 8/17, including intermittent fevers, n/v, poor po tolerance, body aches, headache, fatigue. T 101 in triage. Pt has been taking abx but has not been able to keep them down.

## 2024-04-19 ENCOUNTER — Encounter (HOSPITAL_COMMUNITY): Payer: Self-pay | Admitting: Family Medicine

## 2024-04-19 ENCOUNTER — Other Ambulatory Visit: Payer: Self-pay

## 2024-04-19 DIAGNOSIS — N939 Abnormal uterine and vaginal bleeding, unspecified: Secondary | ICD-10-CM | POA: Insufficient documentation

## 2024-04-19 DIAGNOSIS — R7401 Elevation of levels of liver transaminase levels: Secondary | ICD-10-CM | POA: Diagnosis not present

## 2024-04-19 DIAGNOSIS — J9601 Acute respiratory failure with hypoxia: Secondary | ICD-10-CM | POA: Diagnosis not present

## 2024-04-19 DIAGNOSIS — J189 Pneumonia, unspecified organism: Secondary | ICD-10-CM | POA: Diagnosis present

## 2024-04-19 LAB — CBC
HCT: 32.4 % — ABNORMAL LOW (ref 36.0–46.0)
Hemoglobin: 11 g/dL — ABNORMAL LOW (ref 12.0–15.0)
MCH: 29.3 pg (ref 26.0–34.0)
MCHC: 34 g/dL (ref 30.0–36.0)
MCV: 86.2 fL (ref 80.0–100.0)
Platelets: 195 K/uL (ref 150–400)
RBC: 3.76 MIL/uL — ABNORMAL LOW (ref 3.87–5.11)
RDW: 12.4 % (ref 11.5–15.5)
WBC: 4.9 K/uL (ref 4.0–10.5)
nRBC: 0 % (ref 0.0–0.2)

## 2024-04-19 LAB — RESPIRATORY PANEL BY PCR

## 2024-04-19 LAB — HEPATITIS PANEL, ACUTE
HCV Ab: NONREACTIVE
Hep A IgM: NONREACTIVE
Hep B C IgM: NONREACTIVE
Hepatitis B Surface Ag: NONREACTIVE

## 2024-04-19 LAB — BASIC METABOLIC PANEL WITH GFR
Anion gap: 10 (ref 5–15)
BUN: 10 mg/dL (ref 6–20)
CO2: 22 mmol/L (ref 22–32)
Calcium: 8.3 mg/dL — ABNORMAL LOW (ref 8.9–10.3)
Chloride: 106 mmol/L (ref 98–111)
Creatinine, Ser: 0.95 mg/dL (ref 0.44–1.00)
GFR, Estimated: >60 mL/min (ref >60)
Glucose, Bld: 115 mg/dL — ABNORMAL HIGH (ref 70–99)
Potassium: 3.3 mmol/L — ABNORMAL LOW (ref 3.5–5.1)
Sodium: 138 mmol/L (ref 135–145)

## 2024-04-19 LAB — HIV ANTIBODY (ROUTINE TESTING W REFLEX): HIV Screen 4th Generation wRfx: NONREACTIVE

## 2024-04-19 LAB — MAGNESIUM: Magnesium: 1.7 mg/dL (ref 1.7–2.4)

## 2024-04-19 LAB — STREP PNEUMONIAE URINARY ANTIGEN: Strep Pneumo Urinary Antigen: NEGATIVE

## 2024-04-19 LAB — PROCALCITONIN: Procalcitonin: 0.1 ng/mL

## 2024-04-19 MED ORDER — SENNOSIDES-DOCUSATE SODIUM 8.6-50 MG PO TABS: 1.0000 | ORAL_TABLET | Freq: Every evening | ORAL | Status: DC | PRN

## 2024-04-19 MED ORDER — PANTOPRAZOLE SODIUM 40 MG PO TBEC
40.0000 mg | DELAYED_RELEASE_TABLET | Freq: Every day | ORAL | Status: DC
  Administered 2024-04-19 – 2024-04-20 (×2): 40 mg via ORAL
  Filled 2024-04-19 (×2): qty 1

## 2024-04-19 MED ORDER — AZITHROMYCIN 250 MG PO TABS
500.0000 mg | ORAL_TABLET | Freq: Every day | ORAL | Status: DC
  Administered 2024-04-20 (×2): 500 mg via ORAL
  Filled 2024-04-19 (×2): qty 2

## 2024-04-19 MED ORDER — ORAL CARE MOUTH RINSE: 15.0000 mL | OROMUCOSAL | Status: DC | PRN

## 2024-04-19 MED ORDER — ACETAMINOPHEN 650 MG RE SUPP: 650.0000 mg | Freq: Four times a day (QID) | RECTAL | Status: DC | PRN

## 2024-04-19 MED ORDER — SODIUM CHLORIDE 0.9 % IV SOLN
2.0000 g | INTRAVENOUS | Status: DC
  Administered 2024-04-20: 2 g via INTRAVENOUS
  Filled 2024-04-19: qty 20

## 2024-04-19 MED ORDER — MAGNESIUM SULFATE IN D5W 1-5 GM/100ML-% IV SOLN
1.0000 g | Freq: Once | INTRAVENOUS | Status: AC
  Administered 2024-04-19: 1 g via INTRAVENOUS
  Filled 2024-04-19: qty 100

## 2024-04-19 MED ORDER — SODIUM CHLORIDE 0.9 % IV SOLN
1.0000 g | Freq: Once | INTRAVENOUS | Status: AC
  Administered 2024-04-19: 1 g via INTRAVENOUS
  Filled 2024-04-19: qty 10

## 2024-04-19 MED ORDER — SODIUM CHLORIDE 0.9% FLUSH
3.0000 mL | Freq: Two times a day (BID) | INTRAVENOUS | Status: DC
  Administered 2024-04-19 – 2024-04-20 (×2): 3 mL via INTRAVENOUS

## 2024-04-19 MED ORDER — PROCHLORPERAZINE EDISYLATE 10 MG/2ML IJ SOLN
5.0000 mg | Freq: Four times a day (QID) | INTRAMUSCULAR | Status: DC | PRN
  Administered 2024-04-19 – 2024-04-20 (×3): 5 mg via INTRAVENOUS
  Filled 2024-04-19 (×3): qty 2

## 2024-04-19 MED ORDER — POTASSIUM CHLORIDE CRYS ER 20 MEQ PO TBCR
40.0000 meq | EXTENDED_RELEASE_TABLET | Freq: Once | ORAL | Status: AC
  Administered 2024-04-19: 40 meq via ORAL
  Filled 2024-04-19: qty 2

## 2024-04-19 MED ORDER — ONDANSETRON HCL 4 MG/2ML IJ SOLN
4.0000 mg | Freq: Once | INTRAMUSCULAR | Status: AC
  Administered 2024-04-19: 4 mg via INTRAVENOUS
  Filled 2024-04-19: qty 2

## 2024-04-19 MED ORDER — SODIUM CHLORIDE 0.9 % IV SOLN
500.0000 mg | Freq: Once | INTRAVENOUS | Status: AC
  Administered 2024-04-19: 500 mg via INTRAVENOUS
  Filled 2024-04-19: qty 5

## 2024-04-19 MED ORDER — GUAIFENESIN 100 MG/5ML PO LIQD: 5.0000 mL | ORAL | Status: DC | PRN

## 2024-04-19 MED ORDER — POTASSIUM CHLORIDE IN NACL 20-0.9 MEQ/L-% IV SOLN
INTRAVENOUS | Status: AC
  Filled 2024-04-19: qty 1000

## 2024-04-19 MED ORDER — ACETAMINOPHEN 500 MG PO TABS
1000.0000 mg | ORAL_TABLET | Freq: Once | ORAL | Status: AC
  Administered 2024-04-19: 1000 mg via ORAL
  Filled 2024-04-19: qty 2

## 2024-04-19 MED ORDER — ENOXAPARIN SODIUM 40 MG/0.4ML IJ SOSY
40.0000 mg | PREFILLED_SYRINGE | INTRAMUSCULAR | Status: DC
  Administered 2024-04-19 – 2024-04-20 (×2): 40 mg via SUBCUTANEOUS
  Filled 2024-04-19 (×2): qty 0.4

## 2024-04-19 MED ORDER — ACETAMINOPHEN 325 MG PO TABS
650.0000 mg | ORAL_TABLET | Freq: Four times a day (QID) | ORAL | Status: DC | PRN
  Administered 2024-04-20: 650 mg via ORAL
  Filled 2024-04-19: qty 2

## 2024-04-19 MED ORDER — BENZONATATE 100 MG PO CAPS
200.0000 mg | ORAL_CAPSULE | Freq: Three times a day (TID) | ORAL | Status: DC
  Administered 2024-04-19 – 2024-04-20 (×5): 200 mg via ORAL
  Filled 2024-04-19 (×5): qty 2

## 2024-04-19 MED ORDER — LACTATED RINGERS IV BOLUS
1000.0000 mL | Freq: Once | INTRAVENOUS | Status: AC
  Administered 2024-04-19: 1000 mL via INTRAVENOUS

## 2024-04-19 MED ORDER — OXYCODONE HCL 5 MG PO TABS: 5.0000 mg | ORAL_TABLET | ORAL | Status: DC | PRN

## 2024-04-19 NOTE — Progress Notes (Addendum)
 PROGRESS NOTE   Kaylee Contreras  FMW:[id number]    DOB: November 13, 1980    DOA: 04/18/2024  PCP: Bertell Satterfield, MD   I have briefly reviewed patients previous medical records in [facility]   Brief Hospital Course:   43 y.o. 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   Patient developed sore throat, nonproductive cough, fever, and malaise 5 days ago.  She was seen in the ED on 04/16/2024, diagnosed with right middle lobe pneumonia, and discharged home with Augmentin .  She has continued to experience nonproductive cough, fevers, and general malaise, but has also developed nausea, vomiting, and diarrhea.  She is unable to keep her antibiotic down due to the N/V.    She tested negative for COVID-19, influenza, and RSV in the ED on 04/16/2024.  She is unaware of any sick contacts.   ED Course: Upon arrival to the ED, patient is found to be febrile to 102.7 F, transient and nonsustained mild tachypnea and tachycardia which have since resolved, and mild hypoxia at 89% requiring 2 L/min [place] oxygen.  Labs are most notable for AST 48, ALT 60, normal WBC, and normal lactate.  Chest x-ray is concerning for persistent RML pneumonia and new atelectasis versus infiltrate at the RLL.   She was placed on 2 L/min supplemental oxygen and treated with a liter NS, Zofran , acetaminophen , Rocephin , and azithromycin .  Admitted for lobar pneumonia with acute hypoxia.       Assessment & Plan:   Lobar pneumonia (RML) Presented with fever of 102.7 F, transient and nonsustained mild tachypnea and tachycardia which have since resolved and mild hypoxia of 89% requiring 2 L/min [place] oxygen Chest x-ray from 8/19: Persistent right middle lobe pneumonia. New  atelectasis or pneumonia in the right lower lobe Serial lactate x 2 negative.  Procalcitonin 0.10 arguing against bacterial infection Minimally elevated AST and ALT, 48 and 60 respectively with concern for atypical pneumonia or viral pneumonia.  Despite mildly elevated D-dimer, low index of suspicion for PTE. Influenza A, B, RSV and SARS coronavirus 2 by RT-PCR negative on 8/17.  Blood culture from 8/17, negative to date. Check respiratory panel (pending), urine strep and Legionella antigen Continue empirically started IV ceftriaxone  and azithromycin .  Added Tessalon  200 mg 3 times daily for incessant and nonproductive coughing. Sepsis ruled out on admission.  Barely met for sepsis on sepsis 2 guidelines and does not qualify under sepsis 3 guidelines.  Acute hypoxia without respiratory failure Transient and nonsustained tachypnea which has since resolved. Hypoxia due to lobar consolidation Oxygen support and wean off as tolerated  Mild transaminitis ?  Related to pneumonia Follow acute hepatitis panel and LFTs in a.m.  Hypokalemia Replace and follow  GERD On Dexilant  at home, started Protonix  here.  Home meds have not yet been reviewed by pharmacy.  Requested pharmacy to review  Body mass index is 26.85 kg/m.   DVT prophylaxis: enoxaparin  (LOVENOX ) injection 40 mg Start: 04/19/24 1000     Code Status: Full Code:  Family Communication: Left VM message for spouse. Disposition:  Status is: Inpatient Remains inpatient appropriate because: IV antibiotics and hypoxia.     Consultants:   None  Procedures:     Subjective:  History as noted above.  Ongoing dry and nonproductive cough.  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  Lives with family and independent of activities of daily living.  Not on home oxygen.  No history of recurrent pneumonias, last pneumonia was several years ago from COVID-19.  Reports history of GERD.  Objective:    Vitals:   04/19/24 0715 04/19/24 0741 04/19/24 0808 04/19/24 0813  BP:    104/70  Pulse:    80  Resp: 20   (!) 24  Temp:  98.6 F (37 C)  98.4 F (36.9 C)  TempSrc:  Oral  Oral  SpO2:    99%  Weight:   66.6 kg   Height:   5' 2 (1.575 m)     General exam: Young female, moderately built and nourished sitting up in bed with intermittent dry coughing but otherwise does not appear in any distress, does not appear septic or toxic. Respiratory system: Diminished breath sounds in the right base but otherwise clear to auscultation without wheezing, rhonchi or crackles.  No increased work of breathing. Cardiovascular system: S1 & S2 heard, RRR. No JVD, murmurs, rubs, gallops or clicks. No pedal edema.  Telemetry personally reviewed, initial mild sinus tachycardia which has since resolved and currently in sinus rhythm. Gastrointestinal system: Abdomen is nondistended, soft and nontender. No organomegaly or masses felt. Normal bowel sounds heard. Central nervous system: Alert and oriented. No focal neurological deficits. Extremities: Symmetric 5 x 5 power. Skin: No rashes, lesions or ulcers Psychiatry: Judgement and insight appear normal. Mood & affect appropriate.     Data Reviewed:   I have personally reviewed following labs and imaging studies   CBC: Recent Labs  Lab 04/16/24 1116 04/18/24 2308 04/19/24 0530  WBC 6.0 6.2 4.9  NEUTROABS 4.7 5.2  --   HGB 14.0 13.2 11.0*  HCT 41.9 38.0 32.4*  MCV 88.6 85.2 86.2  PLT 207 239 195    Basic Metabolic Panel: Recent Labs  Lab 04/16/24 1116 04/18/24 2308 04/19/24 0530  NA 135 135 138  K 3.7 3.4* 3.3*  CL 103 103 106  CO2 22 19* 22  GLUCOSE 102* 116* 115*  BUN 14 11 10   CREATININE 1.14* 1.01* 0.95  CALCIUM 8.9 8.8* 8.3*  MG  --  1.7  --     Liver Function Tests: Recent Labs  Lab 04/16/24 1116 04/18/24 2308  AST 25 48*  ALT 20 60*  ALKPHOS 82 103  BILITOT 0.4 0.6  PROT 7.7 7.4  ALBUMIN 3.6 3.3*    CBG: No  results for input(s): GLUCAP in the last 168 hours.  Microbiology Studies:   Recent Results (from the past 240 hours)  Culture, blood (Routine x 2)     Status: None (Preliminary result)   Collection Time: 04/16/24 11:16 AM   Specimen: Right Antecubital; Blood  Result Value Ref Range Status   Specimen Description   Final    RIGHT ANTECUBITAL BOTTLES DRAWN AEROBIC AND ANAEROBIC   Special Requests Blood Culture adequate volume  Final   Culture   Final    NO GROWTH 3 DAYS Performed at [facility], [address] [place], [place] [address]    Report Status PENDING  Incomplete  Resp panel by RT-PCR (RSV, Flu A&B, Covid) Anterior Nasal Swab     Status: None   Collection Time: 04/16/24 11:25 AM   Specimen: Anterior Nasal Swab  Result Value Ref Range Status   SARS Coronavirus 2 by RT PCR NEGATIVE NEGATIVE Final    Comment: (NOTE)  SARS-CoV-2 target nucleic acids are NOT DETECTED.  The SARS-CoV-2 RNA is generally detectable in upper respiratory specimens during the acute phase of infection. The lowest concentration of SARS-CoV-2 viral copies this assay can detect is 138 copies/mL. A negative result does not preclude SARS-Cov-2 infection and should not be used as the sole basis for treatment or other patient management decisions. A negative result may occur with  improper specimen collection/handling, submission of specimen other than nasopharyngeal swab, presence of viral mutation(s) within the areas targeted by this assay, and inadequate number of viral copies(<138 copies/mL). A negative result must be combined with clinical observations, patient history, and epidemiological information. The expected result is Negative.  Fact Sheet for Patients:  BloggerCourse.com  Fact Sheet for Healthcare Providers:  SeriousBroker.it  This test is no t yet approved or cleared by the [place]  FDA and  has been authorized for detection and/or  diagnosis of SARS-CoV-2 by FDA under an Emergency Use Authorization (EUA). This EUA will remain  in effect (meaning this test can be used) for the duration of the COVID-19 declaration under Section 564(b)(1) of the Act, 21 U.S.C.section 360bbb-3(b)(1), unless the authorization is terminated  or revoked sooner.       Influenza A by PCR NEGATIVE NEGATIVE Final   Influenza B by PCR NEGATIVE NEGATIVE Final    Comment: (NOTE) The Xpert Xpress SARS-CoV-2/FLU/RSV plus assay is intended as an aid in the diagnosis of influenza from Nasopharyngeal swab specimens and should not be used as a sole basis for treatment. Nasal washings and aspirates are unacceptable for Xpert Xpress SARS-CoV-2/FLU/RSV testing.  Fact Sheet for Patients: BloggerCourse.com  Fact Sheet for Healthcare Providers: SeriousBroker.it  This test is not yet approved or cleared by the [place]  FDA and has been authorized for detection and/or diagnosis of SARS-CoV-2 by FDA under an Emergency Use Authorization (EUA). This EUA will remain in effect (meaning this test can be used) for the duration of the COVID-19 declaration under Section 564(b)(1) of the Act, 21 U.S.C. section 360bbb-3(b)(1), unless the authorization is terminated or revoked.     Resp Syncytial Virus by PCR NEGATIVE NEGATIVE Final    Comment: (NOTE) Fact Sheet for Patients: BloggerCourse.com  Fact Sheet for Healthcare Providers: SeriousBroker.it  This test is not yet approved or cleared by the [place]  FDA and has been authorized for detection and/or diagnosis of SARS-CoV-2 by FDA under an Emergency Use Authorization (EUA). This EUA will remain in effect (meaning this test can be used) for the duration of the COVID-19 declaration under Section 564(b)(1) of the Act, 21 U.S.C. section 360bbb-3(b)(1), unless the authorization is terminated  or revoked.  Performed at [facility], [address], [place], [place] [address]     Radiology Studies:  DG Chest 2 View Result Date: 04/18/2024 CLINICAL DATA:  Persistent cough, fever EXAM: CHEST - 2 VIEW COMPARISON:  04/16/2024 FINDINGS: Persistent consolidation in the right middle lobe compatible with pneumonia. New consolidation or atelectasis in the right lower lobe. There are left lung is clear. No pleural effusion or pneumothorax. IMPRESSION: Persistent right middle lobe pneumonia. New atelectasis or pneumonia in the right lower lobe. Electronically Signed   By: Norman Gatlin M.D.   On: 04/18/2024 23:05    Scheduled Meds:    [START ON 04/20/2024] azithromycin   500 mg Oral Daily   enoxaparin  (LOVENOX ) injection  40 mg Subcutaneous Q24H   pantoprazole   40 mg Oral Daily   sodium chloride  flush  3 mL Intravenous Q12H  Continuous Infusions:    0.9 % NaCl with KCl 20 mEq / L 125 mL/hr at 04/19/24 0827   [START ON 04/20/2024] cefTRIAXone  (ROCEPHIN )  IV       LOS: 0 days     Trenda Mar, MD,  FACP, [facility], [facility], [facility]   [facility] [place]      To contact the attending provider between 7A-7P or the covering provider during after hours 7P-7A, please log into the web site [email or web address] and access using universal [place] password for that web site. If you do not have the password, please call the hospital operator.  04/19/2024, 10:11 AM

## 2024-04-19 NOTE — ED Notes (Signed)
 Pt has held down crackers and water so far. Pt on o2 2L [place], per pt her sats dropped a little earlier

## 2024-04-19 NOTE — Plan of Care (Signed)
   Problem: Education: Goal: Knowledge of General Education information will improve Description Including pain rating scale, medication(s)/side effects and non-pharmacologic comfort measures Outcome: Progressing   Problem: Education: Goal: Knowledge of General Education information will improve Description Including pain rating scale, medication(s)/side effects and non-pharmacologic comfort measures Outcome: Progressing

## 2024-04-19 NOTE — H&P (Signed)
 History and Physical    Kaylee Contreras FMW:[id number] DOB: 10-13-1980 DOA: 04/18/2024  PCP: Bertell Satterfield, MD   Patient coming from: Home   Chief Complaint: Fever, aches, cough, N/V/D   HPI: Kaylee Contreras is a 43 y.o. 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  Patient developed sore throat, nonproductive cough, fever, and malaise 5 days ago.  She was seen in the ED on 04/16/2024, diagnosed with right middle lobe pneumonia, and discharged home with Augmentin .  She has continued to experience nonproductive cough, fevers, and general malaise, but has also developed nausea, vomiting, and diarrhea.  She is unable to keep her antibiotic down due to the N/V.   She tested negative for COVID-19, influenza, and RSV in the ED on 04/16/2024.  She is unaware of any sick contacts.  ED Course: Upon arrival to the ED, patient is found to be febrile to 39.3 C and saturating upper 80s to low 90s on room air while at rest with mild tachypnea, tachycardia, and stable blood pressure.  Labs are most notable for AST 48, ALT 60, normal WBC, and normal lactate.  Chest x-ray is concerning for persistent RML pneumonia and new atelectasis versus infiltrate at the RLL.  She was placed on 2 L/min supplemental oxygen and treated with a liter NS, Zofran , acetaminophen , Rocephin , and azithromycin .  Review of Systems:  All other systems reviewed and apart from HPI, are negative.  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 Her smoking use included cigarettes. She has never used smokeless tobacco. She reports current alcohol use. She reports that she does not use drugs.  Allergies  Allergen Reactions   Iodinated Contrast Media     only has 1 kidney   Nsaids Other (See Comments)    Only one kidney   Other     IV contrast-due to only having one kidney   Prednisone Other (See Comments)    Patient is unsure of reaction type---possibly due to 1 kidney issue    Family History  Problem Relation Age of Onset   Renal Disease Father        FSGS   High Cholesterol Mother    Dementia Maternal Grandmother    Lung disease Paternal Grandfather    Colon cancer Neg Hx      Prior to Admission medications   Medication Sig Start Date End Date Taking? Authorizing Provider  amoxicillin -clavulanate (AUGMENTIN ) 875-125 MG tablet Take 1 tablet by mouth every 12 (twelve) hours. 04/16/24   Cleotilde Rogue, MD  benzonatate  (TESSALON ) 100 MG capsule Take 1 capsule (100 mg total) by mouth every 8 (eight) hours. 04/16/24   Cleotilde Rogue, MD  DEXILANT  60 MG capsule TAKE 1 CAPSULE BY MOUTH    DAILY BEFORE BREAKFAST 05/10/20   Marvis Camellia LABOR, NP  eletriptan (RELPAX) 20  MG tablet Take 20 mg by mouth daily as needed for migraine.  12/13/18   [provider]  estrogens, conjugated, (PREMARIN) 0.3 MG tablet Take 0.3 mg by mouth daily. Take daily for 21 days then do not take for 7 days.    [provider]  semaglutide-weight management (WEGOVY) 0.25 MG/0.5ML SOAJ SQ injection Inject 0.25 mg into the skin.    [provider]  sucralfate  (CARAFATE ) 1 g tablet Take 1 g by mouth 4 (four) times daily as needed (acid reflux symptoms).  01/03/19   [provider]  testosterone (ANDROGEL) 50 MG/5GM (1%) GEL Place 5 g onto the skin daily.    [provider]    Physical Exam: Vitals:   04/19/24 0330 04/19/24 0345 04/19/24  0400 04/19/24 0415  BP: 103/65 103/65  105/66  Pulse: 88 81 80 80  Resp: (!) 24 20 18  (!) 21  Temp:      TempSrc:      SpO2: 90% 92% 91% 96%  Weight:      Height:        Constitutional: NAD, calm  Eyes: PERTLA, lids and conjunctivae normal ENMT: Mucous membranes are moist. Posterior pharynx clear of any exudate or lesions.   Neck: supple, no masses  Respiratory: no wheezing, no crackles. No accessory muscle use.  Cardiovascular: S1 & S2 heard, regular rate and rhythm. No extremity edema.  Abdomen: No distension, no tenderness, soft. Bowel sounds active.  Musculoskeletal: no clubbing / cyanosis. No joint deformity upper and lower extremities.   Skin: no significant rashes, lesions, ulcers. Warm, dry, well-perfused. Neurologic: CN 2-12 grossly intact. Moving all extremities. Alert and oriented.  Psychiatric: Pleasant. Cooperative.    Labs and Imaging on Admission: I have personally reviewed following labs and imaging studies  CBC: Recent Labs  Lab 04/16/24 1116 04/18/24 2308  WBC 6.0 6.2  NEUTROABS 4.7 5.2  HGB 14.0 13.2  HCT 41.9 38.0  MCV 88.6 85.2  PLT 207 239   Basic Metabolic Panel: Recent Labs  Lab 04/16/24 1116 04/18/24 2308  NA 135 135  K 3.7 3.4*  CL 103 103  CO2 22 19*  GLUCOSE 102* 116*  BUN 14 11  CREATININE 1.14* 1.01*  CALCIUM 8.9 8.8*  MG  --  1.7   GFR: Estimated Creatinine Clearance: 65.6 mL/min (A) (by C-G formula based on SCr of 1.01 mg/dL (H)). Liver Function Tests: Recent Labs  Lab 04/16/24 1116 04/18/24 2308  AST 25 48*  ALT 20 60*  ALKPHOS 82 103  BILITOT 0.4 0.6  PROT 7.7 7.4  ALBUMIN 3.6 3.3*   No results for input(s): LIPASE, AMYLASE in the last 168 hours. No results for input(s): AMMONIA in the last 168 hours. Coagulation Profile: Recent Labs  Lab 04/16/24 1116  INR 1.1   Cardiac Enzymes: No results for input(s): CKTOTAL, CKMB, CKMBINDEX, TROPONINI in the last 168 hours. BNP (last 3 results) No  results for input(s): PROBNP in the last 8760 hours. HbA1C: No results for input(s): HGBA1C in the last 72 hours. CBG: No results for input(s): GLUCAP in the last 168 hours. Lipid Profile: No results for input(s): CHOL, HDL, LDLCALC, TRIG, CHOLHDL, LDLDIRECT in the last 72 hours. Thyroid Function Tests: No results for input(s): TSH, T4TOTAL, FREET4, T3FREE, THYROIDAB in the last 72 hours. Anemia Panel: No results for input(s): VITAMINB12, FOLATE, FERRITIN, TIBC, IRON, RETICCTPCT in the last 72 hours. Urine analysis:    Component Value Date/Time   COLORURINE YELLOW 07/05/2007 1935  APPEARANCEUR CLEAR 07/05/2007 1935   LABSPEC 1.010 07/05/2007 1935   PHURINE 6.5 07/05/2007 1935   GLUCOSEU NEGATIVE 07/05/2007 1935   HGBUR NEGATIVE 07/05/2007 1935   BILIRUBINUR NEGATIVE 07/05/2007 1935   KETONESUR NEGATIVE 07/05/2007 1935   PROTEINUR NEGATIVE 07/05/2007 1935   UROBILINOGEN 0.2 07/05/2007 1935   NITRITE NEGATIVE 07/05/2007 1935   LEUKOCYTESUR  07/05/2007 1935    NEGATIVE MICROSCOPIC NOT DONE ON URINES WITH NEGATIVE PROTEIN, BLOOD, LEUKOCYTES, NITRITE, OR GLUCOSE <1000 mg/dL.   Sepsis Labs: @LABRCNTIP (procalcitonin:4,lacticidven:4) ) Recent Results (from the past 240 hours)  Culture, blood (Routine x 2)     Status: None (Preliminary result)   Collection Time: 04/16/24 11:16 AM   Specimen: Right Antecubital; Blood  Result Value Ref Range Status   Specimen Description   Final    RIGHT ANTECUBITAL BOTTLES DRAWN AEROBIC AND ANAEROBIC   Special Requests Blood Culture adequate volume  Final   Culture   Final    NO GROWTH 2 DAYS Performed at [facility], [address], [place], [place] [address]    Report Status PENDING  Incomplete  Resp panel by RT-PCR (RSV, Flu A&B, Covid) Anterior Nasal Swab     Status: None   Collection Time: 04/16/24 11:25 AM   Specimen: Anterior Nasal Swab  Result Value Ref Range Status   SARS Coronavirus 2 by RT PCR  NEGATIVE NEGATIVE Final    Comment: (NOTE) SARS-CoV-2 target nucleic acids are NOT DETECTED.  The SARS-CoV-2 RNA is generally detectable in upper respiratory specimens during the acute phase of infection. The lowest concentration of SARS-CoV-2 viral copies this assay can detect is 138 copies/mL. A negative result does not preclude SARS-Cov-2 infection and should not be used as the sole basis for treatment or other patient management decisions. A negative result may occur with  improper specimen collection/handling, submission of specimen other than nasopharyngeal swab, presence of viral mutation(s) within the areas targeted by this assay, and inadequate number of viral copies(<138 copies/mL). A negative result must be combined with clinical observations, patient history, and epidemiological information. The expected result is Negative.  Fact Sheet for Patients:  BloggerCourse.com  Fact Sheet for Healthcare Providers:  SeriousBroker.it  This test is no t yet approved or cleared by the [place]  FDA and  has been authorized for detection and/or diagnosis of SARS-CoV-2 by FDA under an Emergency Use Authorization (EUA). This EUA will remain  in effect (meaning this test can be used) for the duration of the COVID-19 declaration under Section 564(b)(1) of the Act, 21 U.S.C.section 360bbb-3(b)(1), unless the authorization is terminated  or revoked sooner.       Influenza A by PCR NEGATIVE NEGATIVE Final   Influenza B by PCR NEGATIVE NEGATIVE Final    Comment: (NOTE) The Xpert Xpress SARS-CoV-2/FLU/RSV plus assay is intended as an aid in the diagnosis of influenza from Nasopharyngeal swab specimens and should not be used as a sole basis for treatment. Nasal washings and aspirates are unacceptable for Xpert Xpress SARS-CoV-2/FLU/RSV testing.  Fact Sheet for Patients: BloggerCourse.com  Fact Sheet for  Healthcare Providers: SeriousBroker.it  This test is not yet approved or cleared by the [place]  FDA and has been authorized for detection and/or diagnosis of SARS-CoV-2 by FDA under an Emergency Use Authorization (EUA). This EUA will remain in effect (meaning this test can be used) for the duration of the COVID-19 declaration under Section 564(b)(1) of the Act, 21 U.S.C. section 360bbb-3(b)(1), unless the authorization is terminated or revoked.  Resp Syncytial Virus by PCR NEGATIVE NEGATIVE Final    Comment: (NOTE) Fact Sheet for Patients: BloggerCourse.com  Fact Sheet for Healthcare Providers: SeriousBroker.it  This test is not yet approved or cleared by the [place]  FDA and has been authorized for detection and/or diagnosis of SARS-CoV-2 by FDA under an Emergency Use Authorization (EUA). This EUA will remain in effect (meaning this test can be used) for the duration of the COVID-19 declaration under Section 564(b)(1) of the Act, 21 U.S.C. section 360bbb-3(b)(1), unless the authorization is terminated or revoked.  Performed at [facility], [address], [place], [place] [address]      Radiological Exams on Admission: DG Chest 2 View Result Date: 04/18/2024 CLINICAL DATA:  Persistent cough, fever EXAM: CHEST - 2 VIEW COMPARISON:  04/16/2024 FINDINGS: Persistent consolidation in the right middle lobe compatible with pneumonia. New consolidation or atelectasis in the right lower lobe. There are left lung is clear. No pleural effusion or pneumothorax. IMPRESSION: Persistent right middle lobe pneumonia. New atelectasis or pneumonia in the right lower lobe. Electronically Signed   By: Norman Gatlin M.D.   On: 04/18/2024 23:05    Assessment/Plan   1. Sepsis d/t pneumonia; acute hypoxic respiratory failure  - Culture blood, check procalcitonin and 20-pathogen respiratory virus panel, continue  Rocephin  and azithromycin  for now, continue supplemental O2 as-needed, supportive care    2. Elevated transaminases  - Mildly elevated transaminases noted with normal bilirubin  - Check viral hepatitis panel, trend LFTs    DVT prophylaxis: Lovenox   Code Status: Full  Level of Care: Level of care: Telemetry Family Communication: None present  Disposition Plan:  Patient is from: home  Anticipated d/c is to: Home  Anticipated d/c date is: 04/21/24 Patient currently: Pending improved oxygenation  Consults called: None  Admission status: Inpatient     Evalene GORMAN Sprinkles, MD [facility]  04/19/2024, 4:41 AM

## 2024-04-19 NOTE — TOC CM/SW Note (Signed)
 Transition of Care [facility]) - Inpatient Brief Assessment   Patient Details  Name: Kaylee Contreras MRN: [id number] Date of Birth: 17-Nov-1980  Transition of Care [facility]) CM/SW Contact:    Noreen KATHEE Cleotilde ISRAEL Phone Number: 04/19/2024, 10:24 AM   Clinical Narrative:   Transition of Care Department [facility]) has reviewed patient and no TOC needs have been identified at this time. We will continue to monitor patient advancement through interdisciplinary progression rounds. If new patient transition needs arise, please place a TOC consult.  Transition of Care Asessment: Insurance and Status: Insurance coverage has been reviewed Patient has primary care physician: Yes Home environment has been reviewed: Single Family Home Prior level of function:: Independent Prior/Current Home Services: No current home services Social Drivers of Health Review: SDOH reviewed no interventions necessary Readmission risk has been reviewed: Yes Transition of care needs: no transition of care needs at this time

## 2024-04-19 NOTE — ED Provider Notes (Signed)
 [place] EMERGENCY DEPARTMENT AT [facility] Provider Note   CSN: [id number] Arrival date & time: 04/18/24  2218     Patient presents with: Shortness of Breath   Kaylee Contreras is a 43 y.o. female.    Shortness of Breath Associated symptoms: cough, fever, headaches and vomiting   Patient presents for flulike symptoms.  Medical history includes migraines, GERD, solitary kidney, constipation.  Onset of symptoms was 5 days ago.  Recent worsening symptoms have included fevers, cough, nausea, vomiting, myalgias, headache, fatigue.  She was seen in the ED yesterday.  X-ray showed a right middle lobe pneumonia.  She was prescribed Augmentin .  She states that she got 2 doses of it yesterday but today, has not been able to take the antibiotics due to p.o. intolerance.  She has had frequent nausea and vomiting.  She has had ongoing cough, severe fatigue, and shortness of breath.     Prior to Admission medications   Medication Sig Start Date End Date Taking? Authorizing Provider  amoxicillin -clavulanate (AUGMENTIN ) 875-125 MG tablet Take 1 tablet by mouth every 12 (twelve) hours. 04/16/24   Cleotilde Rogue, MD  benzonatate  (TESSALON ) 100 MG capsule Take 1 capsule (100 mg total) by mouth every 8 (eight) hours. 04/16/24   Cleotilde Rogue, MD  DEXILANT  60 MG capsule TAKE 1 CAPSULE BY MOUTH    DAILY BEFORE BREAKFAST 05/10/20   Marvis Camellia LABOR, NP  eletriptan (RELPAX) 20 MG tablet Take 20 mg by mouth daily as needed for migraine.  12/13/18   [provider]  estrogens, conjugated, (PREMARIN) 0.3 MG tablet Take 0.3 mg by mouth daily. Take daily for 21 days then do not take for 7 days.    [provider]  semaglutide-weight management (WEGOVY) 0.25 MG/0.5ML SOAJ SQ injection Inject 0.25 mg into the skin.    [provider]  sucralfate  (CARAFATE ) 1 g tablet Take 1 g by mouth 4 (four) times daily as needed (acid reflux symptoms).  01/03/19   [provider]  testosterone  (ANDROGEL) 50 MG/5GM (1%) GEL Place 5 g onto the skin daily.    [provider]    Allergies: Iodinated contrast media, Nsaids, Other, and Prednisone    Review of Systems  Constitutional:  Positive for activity change, appetite change, fatigue and fever.  Respiratory:  Positive for cough and shortness of breath.   Gastrointestinal:  Positive for nausea and vomiting.  Musculoskeletal:  Positive for myalgias.  Neurological:  Positive for weakness (Generalized) and headaches.    Updated Vital Signs BP 103/65   Pulse 81   Temp (!) 100.9 F (38.3 C) (Oral)   Resp 20   Ht 5' 2 (1.575 m)   Wt 68 kg   LMP 11/23/2019   SpO2 92%   BMI 27.44 kg/m   Physical Exam Vitals and nursing note reviewed.  Constitutional:      General: She is not in acute distress.    Appearance: She is well-developed. She is not ill-appearing, toxic-appearing or diaphoretic.  HENT:     Head: Normocephalic and atraumatic.  Eyes:     Conjunctiva/sclera: Conjunctivae normal.  Cardiovascular:     Rate and Rhythm: Regular rhythm. Tachycardia present.     Heart sounds: No murmur heard. Pulmonary:     Effort: Pulmonary effort is normal. Tachypnea present. No respiratory distress.     Breath sounds: Rhonchi present. No wheezing.  Abdominal:     Palpations: Abdomen is soft.     Tenderness: There is  no abdominal tenderness.  Musculoskeletal:        General: No swelling. Normal range of motion.     Cervical back: Normal range of motion and neck supple.  Skin:    General: Skin is warm and dry.     Coloration: Skin is not cyanotic or pale.  Neurological:     General: No focal deficit present.     Mental Status: She is alert and oriented to person, place, and time.  Psychiatric:        Mood and Affect: Mood normal.        Behavior: Behavior normal.     (all labs ordered are listed, but only abnormal results are displayed) Labs Reviewed  COMPREHENSIVE METABOLIC PANEL WITH GFR - Abnormal; Notable  for the following components:      Result Value   Potassium 3.4 (*)    CO2 19 (*)    Glucose, Bld 116 (*)    Creatinine, Ser 1.01 (*)    Calcium 8.8 (*)    Albumin 3.3 (*)    AST 48 (*)    ALT 60 (*)    All other components within normal limits  LACTIC ACID, PLASMA  CBC WITH DIFFERENTIAL/PLATELET  MAGNESIUM     EKG: None  Radiology: DG Chest 2 View Result Date: 04/18/2024 CLINICAL DATA:  Persistent cough, fever EXAM: CHEST - 2 VIEW COMPARISON:  04/16/2024 FINDINGS: Persistent consolidation in the right middle lobe compatible with pneumonia. New consolidation or atelectasis in the right lower lobe. There are left lung is clear. No pleural effusion or pneumothorax. IMPRESSION: Persistent right middle lobe pneumonia. New atelectasis or pneumonia in the right lower lobe. Electronically Signed   By: Norman Gatlin M.D.   On: 04/18/2024 23:05     Procedures   Medications Ordered in the ED  lactated ringers  bolus 1,000 mL (0 mLs Intravenous Stopped 04/19/24 0206)  ondansetron  (ZOFRAN ) injection 4 mg (4 mg Intravenous Given 04/19/24 0034)  cefTRIAXone  (ROCEPHIN ) 1 g in sodium chloride  0.9 % 100 mL IVPB (0 g Intravenous Stopped 04/19/24 0109)  azithromycin  (ZITHROMAX ) 500 mg in sodium chloride  0.9 % 250 mL IVPB (0 mg Intravenous Stopped 04/19/24 0211)  acetaminophen  (TYLENOL ) tablet 1,000 mg (1,000 mg Oral Given 04/19/24 0035)                                    Medical Decision Making Amount and/or Complexity of Data Reviewed Labs: ordered. Radiology: ordered.  Risk OTC drugs. Prescription drug management.   This patient presents to the ED for concern of ongoing flulike symptoms, this involves an extensive number of treatment options, and is a complaint that carries with it a high risk of complications and morbidity.  The differential diagnosis includes worsening pneumonia, dehydration, metabolic derangements   Co morbidities / Chronic conditions that complicate the patient  evaluation  migraines, GERD, solitary kidney, constipation   Additional history obtained:  Additional history obtained from EMR External records from outside source obtained and reviewed including N/A   Lab Tests:  I Ordered, and personally interpreted labs.  The pertinent results include: Baseline creatinine, mild hypokalemia with otherwise normal electrolytes, normal hemoglobin, no leukocytosis, normal lactate   Imaging Studies ordered:  I ordered imaging studies including chest x-ray I independently visualized and interpreted imaging which showed demonstration of known right middle lobe pneumonia, atelectasis versus pneumonia in right lower lobe. I agree with the radiologist interpretation  Cardiac Monitoring: / EKG:  The patient was maintained on a cardiac monitor.  I personally viewed and interpreted the cardiac monitored which showed an underlying rhythm of: Sinus rhythm   Problem List / ED Course / Critical interventions / Medication management  Patient presenting for ongoing symptoms of fevers, fatigue, cough, shortness of breath, nausea, vomiting, all secondary to known pneumonia.  This was diagnosed yesterday.  Because of her nausea, vomiting, and p.o. intolerance, she has not been able to take any medications today, including her antibiotics.  She is febrile, tachycardic, and tachypneic on arrival.  On exam, she is able to speak in complete sentences.  Given her persistent nausea and vomiting today, IV fluids were ordered for hydration.  Tylenol  was ordered for antipyresis.  IV antibiotics were initiated.  Zofran  was ordered for nausea.  Repeat workup was ordered.  X-ray shows possible development of a right lower lobe pneumonia in addition to right middle lobe pneumonia identified yesterday on x-ray.  Lab work results were unremarkable.  On reassessment, patient's tachycardia and tachypnea have improved.  She is now tolerating p.o. intake.  She will have desaturations into  the high 80s on room air.  She was placed on 2 L of supplemental oxygen.  Patient to be admitted for further management. I ordered medication including IV fluids for hydration, Tylenol  for antipyresis, Zofran  for nausea, ceftriaxone  and azithromycin  for pneumonia Reevaluation of the patient after these medicines showed that the patient improved I have reviewed the patients home medicines and have made adjustments as needed  Social Determinants of Health:  Lives at home with husband      Final diagnoses:  Pneumonia of right middle lobe due to infectious organism  Acute respiratory failure with hypoxia [facility])    ED Discharge Orders     None          Melvenia Motto, MD 04/19/24 [id number]

## 2024-04-20 DIAGNOSIS — J189 Pneumonia, unspecified organism: Secondary | ICD-10-CM | POA: Diagnosis not present

## 2024-04-20 LAB — CBC
HCT: 32.6 % — ABNORMAL LOW (ref 36.0–46.0)
Hemoglobin: 11 g/dL — ABNORMAL LOW (ref 12.0–15.0)
MCH: 29.5 pg (ref 26.0–34.0)
MCHC: 33.7 g/dL (ref 30.0–36.0)
MCV: 87.4 fL (ref 80.0–100.0)
Platelets: 212 K/uL (ref 150–400)
RBC: 3.73 MIL/uL — ABNORMAL LOW (ref 3.87–5.11)
RDW: 12.5 % (ref 11.5–15.5)
WBC: 3.8 K/uL — ABNORMAL LOW (ref 4.0–10.5)
nRBC: 0 % (ref 0.0–0.2)

## 2024-04-20 LAB — HEPATIC FUNCTION PANEL
ALT: 49 U/L — ABNORMAL HIGH (ref 0–44)
AST: 44 U/L — ABNORMAL HIGH (ref 15–41)
Albumin: 2.7 g/dL — ABNORMAL LOW (ref 3.5–5.0)
Alkaline Phosphatase: 77 U/L (ref 38–126)
Bilirubin, Direct: <0.1 mg/dL (ref 0.0–0.2)
Total Bilirubin: 0.4 mg/dL (ref 0.0–1.2)
Total Protein: 6.2 g/dL — ABNORMAL LOW (ref 6.5–8.1)

## 2024-04-20 LAB — BASIC METABOLIC PANEL WITH GFR
Anion gap: 8 (ref 5–15)
BUN: 9 mg/dL (ref 6–20)
CO2: 25 mmol/L (ref 22–32)
Calcium: 8.5 mg/dL — ABNORMAL LOW (ref 8.9–10.3)
Chloride: 106 mmol/L (ref 98–111)
Creatinine, Ser: 0.93 mg/dL (ref 0.44–1.00)
GFR, Estimated: >60 mL/min (ref >60)
Glucose, Bld: 86 mg/dL (ref 70–99)
Potassium: 3.8 mmol/L (ref 3.5–5.1)
Sodium: 139 mmol/L (ref 135–145)

## 2024-04-20 LAB — LEGIONELLA PNEUMOPHILA SEROGP 1 UR AG: L. pneumophila Serogp 1 Ur Ag: NEGATIVE

## 2024-04-20 LAB — MAGNESIUM: Magnesium: 1.9 mg/dL (ref 1.7–2.4)

## 2024-04-20 LAB — PROCALCITONIN: Procalcitonin: <0.1 ng/mL

## 2024-04-20 MED ORDER — SODIUM CHLORIDE 0.9 % IV SOLN
2.0000 g | Freq: Once | INTRAVENOUS | Status: AC
  Administered 2024-04-20: 2 g via INTRAVENOUS
  Filled 2024-04-20: qty 20

## 2024-04-20 MED ORDER — CEFDINIR 300 MG PO CAPS: 300.0000 mg | ORAL_CAPSULE | Freq: Two times a day (BID) | ORAL | 0 refills | Status: AC

## 2024-04-20 NOTE — Plan of Care (Signed)
  Problem: Education: Goal: Knowledge of General Education information will improve Description: Including pain rating scale, medication(s)/side effects and non-pharmacologic comfort measures 04/20/2024 1815 by Brien Damien KIDD, RN Outcome: Adequate for Discharge 04/20/2024 1815 by Brien Damien KIDD, RN Outcome: Adequate for Discharge   Problem: Health Behavior/Discharge Planning: Goal: Ability to manage health-related needs will improve 04/20/2024 1815 by Brien Damien KIDD, RN Outcome: Adequate for Discharge 04/20/2024 1815 by Brien Damien KIDD, RN Outcome: Adequate for Discharge   Problem: Clinical Measurements: Goal: Ability to maintain clinical measurements within normal limits will improve 04/20/2024 1815 by Brien Damien KIDD, RN Outcome: Adequate for Discharge 04/20/2024 1815 by Brien Damien KIDD, RN Outcome: Adequate for Discharge Goal: Will remain free from infection 04/20/2024 1815 by Brien Damien KIDD, RN Outcome: Adequate for Discharge 04/20/2024 1815 by Brien Damien KIDD, RN Outcome: Adequate for Discharge Goal: Diagnostic test results will improve 04/20/2024 1815 by Brien Damien KIDD, RN Outcome: Adequate for Discharge 04/20/2024 1815 by Brien Damien KIDD, RN Outcome: Adequate for Discharge Goal: Respiratory complications will improve 04/20/2024 1815 by Brien Damien KIDD, RN Outcome: Adequate for Discharge 04/20/2024 1815 by Brien Damien KIDD, RN Outcome: Adequate for Discharge Goal: Cardiovascular complication will be avoided 04/20/2024 1815 by Brien Damien KIDD, RN Outcome: Adequate for Discharge 04/20/2024 1815 by Brien Damien KIDD, RN Outcome: Adequate for Discharge   Problem: Activity: Goal: Risk for activity intolerance will decrease 04/20/2024 1815 by Brien Damien KIDD, RN Outcome: Adequate for Discharge 04/20/2024 1815 by Brien Damien KIDD, RN Outcome: Adequate for Discharge   Problem: Nutrition: Goal: Adequate nutrition will be maintained 04/20/2024 1815 by Brien Damien KIDD, RN Outcome:  Adequate for Discharge 04/20/2024 1815 by Brien Damien KIDD, RN Outcome: Adequate for Discharge   Problem: Coping: Goal: Level of anxiety will decrease 04/20/2024 1815 by Brien Damien KIDD, RN Outcome: Adequate for Discharge 04/20/2024 1815 by Brien Damien KIDD, RN Outcome: Adequate for Discharge   Problem: Elimination: Goal: Will not experience complications related to bowel motility 04/20/2024 1815 by Brien Damien KIDD, RN Outcome: Adequate for Discharge 04/20/2024 1815 by Brien Damien KIDD, RN Outcome: Adequate for Discharge Goal: Will not experience complications related to urinary retention 04/20/2024 1815 by Brien Damien KIDD, RN Outcome: Adequate for Discharge 04/20/2024 1815 by Brien Damien KIDD, RN Outcome: Adequate for Discharge   Problem: Pain Managment: Goal: General experience of comfort will improve and/or be controlled 04/20/2024 1815 by Brien Damien KIDD, RN Outcome: Adequate for Discharge 04/20/2024 1815 by Brien Damien KIDD, RN Outcome: Adequate for Discharge   Problem: Safety: Goal: Ability to remain free from injury will improve 04/20/2024 1815 by Brien Damien KIDD, RN Outcome: Adequate for Discharge 04/20/2024 1815 by Brien Damien KIDD, RN Outcome: Adequate for Discharge   Problem: Skin Integrity: Goal: Risk for impaired skin integrity will decrease 04/20/2024 1815 by Brien Damien KIDD, RN Outcome: Adequate for Discharge 04/20/2024 1815 by Brien Damien KIDD, RN Outcome: Adequate for Discharge   Problem: Activity: Goal: Ability to tolerate increased activity will improve 04/20/2024 1815 by Brien Damien KIDD, RN Outcome: Adequate for Discharge 04/20/2024 1815 by Brien Damien KIDD, RN Outcome: Adequate for Discharge   Problem: Clinical Measurements: Goal: Ability to maintain a body temperature in the normal range will improve Outcome: Adequate for Discharge   Problem: Respiratory: Goal: Ability to maintain adequate ventilation will improve Outcome: Adequate for Discharge Goal: Ability  to maintain a clear airway will improve Outcome: Adequate for Discharge

## 2024-04-20 NOTE — Plan of Care (Signed)

## 2024-04-20 NOTE — Discharge Instructions (Signed)

## 2024-04-20 NOTE — Discharge Summary (Signed)
 Physician Discharge Summary  Kaylee Contreras FMW:[id number] DOB: 1981-01-27  PCP: Bertell Satterfield, MD  Admitted from: Home Discharged to: Home  Admit date: 04/18/2024 Discharge date: 04/20/2024  Recommendations for Outpatient Follow-up:    Follow-up Information     Bertell Satterfield, MD. Schedule an appointment as soon as possible for a visit in 1 week(s).   Specialty: Internal Medicine Why: To be seen with repeat labs (CBC & CMP).  Recommend repeating chest x-ray in 4 weeks to ensure resolution of pneumonia findings. Contact information: [address] [facility] [place] [address] [phone]                  Home Health: None    Equipment/Devices: None none    Discharge Condition: Improved and stable.   Code Status: Full Code Diet recommendation:  Discharge Diet Orders (From admission, onward)     Start     Ordered   04/20/24 0000  Diet general        04/20/24 1543             Discharge Diagnoses:  Principal Problem:   Pneumonia Active Problems:   Acute respiratory failure with hypoxia (HCC)   Elevated transaminase level   Brief Hospital Course:  43 y.o. 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   Patient developed sore throat, nonproductive cough, fever, and malaise 5 days ago.  She was seen in the ED on 04/16/2024, diagnosed with right middle lobe pneumonia, and discharged home with Augmentin .  She has continued to experience nonproductive cough, fevers, and general malaise, but has also developed nausea, vomiting, and diarrhea.  She is unable to keep her antibiotic down due to the N/V.    She tested negative for COVID-19, influenza, and RSV in the ED on 04/16/2024.  She is unaware of any  sick contacts.   ED Course: Upon arrival to the ED, patient is found to be febrile to 102.7 F, transient and nonsustained mild tachypnea and tachycardia which have since resolved, and mild hypoxia at 89% requiring 2 L/min [place] oxygen.  Labs are most notable for AST 48, ALT 60, normal WBC, and normal lactate.  Chest x-ray is concerning for persistent RML pneumonia and new atelectasis versus infiltrate at the RLL.   She was placed on 2 L/min supplemental oxygen and treated with a liter NS, Zofran , acetaminophen , Rocephin , and azithromycin .   Admitted for lobar pneumonia with acute hypoxia.         Assessment & Plan:    Lobar pneumonia (RML, RLL) Presented with fever of 102.7 F, transient and nonsustained mild tachypnea and tachycardia which have since resolved and mild hypoxia of 89% requiring 2 L/min [place] oxygen Chest x-ray from 8/19: Persistent right middle lobe pneumonia. New atelectasis or pneumonia in the right lower lobe Serial lactate x 2 negative.  Procalcitonin 0.10, <0.10 arguing against bacterial infection, however patient has shown clinical improvement with IV antibiotics. Minimally elevated AST and ALT, 48 and 60 respectively with concern for atypical pneumonia or viral pneumonia.  Despite mildly elevated D-dimer, low index of suspicion for PTE. Influenza A, B, RSV and SARS coronavirus 2 by RT-PCR negative on 8/17.  Blood culture from 8/17, negative to date. Urine strep antigen negative.  Extended respiratory viral panel negative.  Urine Legionella antigen results pending and can be followed up as outpatient. Sepsis ruled out on admission.  Barely met for sepsis on sepsis 2 guidelines and does not qualify under sepsis 3 guidelines. Patient has completed 3 doses of azithromycin  500 mg each, 2 doses of IV ceftriaxone  thus far.  Defervesced for more than 24 hours.  Advised patient that it would be preferable to monitor her overnight to ensure sustained improvement prior to possible DC  tomorrow.  However patient insists that she feels much better and wants to be discharged today.  Patient's RN also confirms that she was walking the halls with no respiratory difficulties, no headache, and nausea from this morning has resolved.  Able to tolerate diet without nausea or vomiting.  RN also reports that she looked much better than she did this morning given. Since she had ongoing symptoms PTA despite Augmentin , discontinued Augmentin  and changed to Omnicef  to start tomorrow to complete total 7 days course.  Recommend repeating chest x-ray in 4 weeks to ensure resolution of pneumonia findings.  This was discussed with patient and she verbalized understanding. Patient was also counseled to seek immediate medical attention if there was any worsening of her medical condition at discharge.  She verbalized understanding.  Patient will discharge after third dose of IV ceftriaxone  this afternoon.   Acute hypoxia without respiratory failure Transient and nonsustained tachypnea which has since resolved. Hypoxia due to lobar consolidation Resolved and saturating well on room air.   Mild transaminitis ?  Related to pneumonia Acute hepatitis panel negative.  AST and ALT are better compared to yesterday.  Close follow-up of CMP as outpatient.   Hypokalemia Replaced.  Magnesium  normal.  Anemia/mild thrombocytopenia ?  Related to acute illness/infectious etiology.  Hemoglobin stable.  Follow CBCs closely as outpatient.   GERD Continue prior home meds including Dexilant  and as needed sucralfate .  Menopausal s/p total hysterectomy Continue PTA HRT as below  Migraine headaches Continue as needed meds as below  History of obesity, [facility] for same.   Body mass index is 26.85 kg/m.    Consultants:   None   Procedures:       Discharge Instructions  Discharge Instructions     Call MD for:  difficulty breathing, headache or visual disturbances   Complete by: As directed     Call MD for:  extreme fatigue   Complete by: As directed    Call MD for:  persistant dizziness or light-headedness   Complete by: As directed    Call MD for:  persistant nausea and vomiting   Complete by: As directed    Call MD for:  severe uncontrolled pain   Complete by: As directed    Call MD for:  temperature >100.4   Complete by: As directed    Diet general   Complete by: As directed    Increase activity slowly   Complete by: As directed         Medication List     STOP taking these medications    amoxicillin -clavulanate 875-125 MG tablet Commonly known as: AUGMENTIN        TAKE these medications    benzonatate  100 MG capsule Commonly known as: TESSALON  Take 1 capsule (100 mg total) by mouth every 8 (eight) hours. What changed:  when to take this reasons to take this   cefdinir  300 MG capsule Commonly known as: OMNICEF  Take 1 capsule (300 mg total) by mouth 2 (two) times daily for 4 days. Start taking  on: April 21, 2024   COUGH & COLD PO Take 1 tablet by mouth 2 (two) times daily as needed (cold).   Dexilant  60 MG capsule Generic drug: dexlansoprazole  TAKE 1 CAPSULE BY MOUTH    DAILY BEFORE BREAKFAST   Dotti 0.075 MG/24HR Generic drug: estradiol Place 1 patch onto the skin 2 (two) times a week.   eletriptan 40 MG tablet Commonly known as: RELPAX Take 40 mg by mouth every 2 (two) hours as needed for migraine or headache.   sucralfate  1 g tablet Commonly known as: CARAFATE  Take 1 g by mouth 4 (four) times daily as needed (acid reflux symptoms).   testosterone 50 MG/5GM (1%) Gel Commonly known as: ANDROGEL Place 5 g onto the skin daily.   Vitamin D (Ergocalciferol) 1.25 MG (50000 UNIT) Caps capsule Commonly known as: DRISDOL Take 50,000 Units by mouth once a week.   Wegovy 1.7 MG/0.75ML Soaj SQ injection Generic drug: semaglutide-weight management Inject 1.7 mg into the skin once a week.       Allergies  Allergen Reactions   Iodinated  Contrast Media Hives    Only has 1 kidney IV contrast dye   Nsaids Other (See Comments)    Pt not supposed to use high doses of steroids due to only having 1 kidney, if possible.    Prednisone Other (See Comments)    Pt not able to tolerate high doses of steroids due to only having 1 kidney, if possible. Irregular heart rate   Tape Other (See Comments)    Bruised skin, skin breakdown      Procedures/Studies: DG Chest 2 View Result Date: 04/18/2024 CLINICAL DATA:  Persistent cough, fever EXAM: CHEST - 2 VIEW COMPARISON:  04/16/2024 FINDINGS: Persistent consolidation in the right middle lobe compatible with pneumonia. New consolidation or atelectasis in the right lower lobe. There are left lung is clear. No pleural effusion or pneumothorax. IMPRESSION: Persistent right middle lobe pneumonia. New atelectasis or pneumonia in the right lower lobe. Electronically Signed   By: Norman Gatlin M.D.   On: 04/18/2024 23:05   DG Chest 2 View Result Date: 04/16/2024 CLINICAL DATA:  Weakness, cough, sepsis EXAM: CHEST - 2 VIEW COMPARISON:  09/05/2019 FINDINGS: Frontal and lateral views of the chest demonstrate an unremarkable cardiac silhouette. Dense airspace disease within the lateral segment of the right middle lobe consistent with pneumonia. No effusion or pneumothorax. No acute bony abnormalities. IMPRESSION: 1. Right middle lobe pneumonia. Electronically Signed   By: Ozell Daring M.D.   On: 04/16/2024 11:59     Subjective: Patient reports that she feels much better.  Coughing has improved.  Mostly intermittent, mild and nonproductive.  No chest pain or dyspnea.  No dyspnea-on ambulating in the halls.  Some nausea and headache this morning which resolved with Tylenol  and as needed antiemetics.  Indicates that she does get intermittent nausea from [facility].  No other complaints reported.  Insist on discharging home today because she feels so much better.  Discharge Exam:  Vitals:   04/20/24  0529 04/20/24 0936 04/20/24 1359 04/20/24 1417  BP:  99/63  101/75  Pulse:  80    Resp:   (!) 21 (!) 22  Temp:  99.6 F (37.6 C)  98.2 F (36.8 C)  TempSrc:  Oral  Oral  SpO2: 96% 92%  96%  Weight:      Height:        General exam: Young female, moderately built and nourished sitting up in bed without distress.  Looks much improved compared to yesterday.  Does not appear septic or toxic. Respiratory system: Diminished breath sounds in the right base with occasional crackles but rest of lung fields clear to auscultation without wheezing or rhonchi.  No increased work of breathing. Cardiovascular system: S1 & S2 heard, RRR. No JVD, murmurs, rubs, gallops or clicks. No pedal edema.  Telemetry personally reviewed: Sinus rhythm. Gastrointestinal system: Abdomen is nondistended, soft and nontender. No organomegaly or masses felt. Normal bowel sounds heard. Central nervous system: Alert and oriented. No focal neurological deficits. Extremities: Symmetric 5 x 5 power. Skin: No rashes, lesions or ulcers Psychiatry: Judgement and insight appear normal. Mood & affect appropriate.    The results of significant diagnostics from this hospitalization (including imaging, microbiology, ancillary and laboratory) are listed below for reference.     Microbiology: Recent Results (from the past 240 hours)  Culture, blood (Routine x 2)     Status: None (Preliminary result)   Collection Time: 04/16/24 11:16 AM   Specimen: Right Antecubital; Blood  Result Value Ref Range Status   Specimen Description   Final    RIGHT ANTECUBITAL BOTTLES DRAWN AEROBIC AND ANAEROBIC   Special Requests Blood Culture adequate volume  Final   Culture   Final    NO GROWTH 4 DAYS Performed at [facility], [address], [place], [place] [address]    Report Status PENDING  Incomplete  Resp panel by RT-PCR (RSV, Flu A&B, Covid) Anterior Nasal Swab     Status: None   Collection Time: 04/16/24 11:25 AM   Specimen: Anterior  Nasal Swab  Result Value Ref Range Status   SARS Coronavirus 2 by RT PCR NEGATIVE NEGATIVE Final    Comment: (NOTE) SARS-CoV-2 target nucleic acids are NOT DETECTED.  The SARS-CoV-2 RNA is generally detectable in upper respiratory specimens during the acute phase of infection. The lowest concentration of SARS-CoV-2 viral copies this assay can detect is 138 copies/mL. A negative result does not preclude SARS-Cov-2 infection and should not be used as the sole basis for treatment or other patient management decisions. A negative result may occur with  improper specimen collection/handling, submission of specimen other than nasopharyngeal swab, presence of viral mutation(s) within the areas targeted by this assay, and inadequate number of viral copies(<138 copies/mL). A negative result must be combined with clinical observations, patient history, and epidemiological information. The expected result is Negative.  Fact Sheet for Patients:  BloggerCourse.com  Fact Sheet for Healthcare Providers:  SeriousBroker.it  This test is no t yet approved or cleared by the [place]  FDA and  has been authorized for detection and/or diagnosis of SARS-CoV-2 by FDA under an Emergency Use Authorization (EUA). This EUA will remain  in effect (meaning this test can be used) for the duration of the COVID-19 declaration under Section 564(b)(1) of the Act, 21 U.S.C.section 360bbb-3(b)(1), unless the authorization is terminated  or revoked sooner.       Influenza A by PCR NEGATIVE NEGATIVE Final   Influenza B by PCR NEGATIVE NEGATIVE Final    Comment: (NOTE) The Xpert Xpress SARS-CoV-2/FLU/RSV plus assay is intended as an aid in the diagnosis of influenza from Nasopharyngeal swab specimens and should not be used as a sole basis for treatment. Nasal washings and aspirates are unacceptable for Xpert Xpress SARS-CoV-2/FLU/RSV testing.  Fact Sheet for  Patients: BloggerCourse.com  Fact Sheet for Healthcare Providers: SeriousBroker.it  This test is not yet approved or cleared by the [place]  FDA and has been authorized for  detection and/or diagnosis of SARS-CoV-2 by FDA under an Emergency Use Authorization (EUA). This EUA will remain in effect (meaning this test can be used) for the duration of the COVID-19 declaration under Section 564(b)(1) of the Act, 21 U.S.C. section 360bbb-3(b)(1), unless the authorization is terminated or revoked.     Resp Syncytial Virus by PCR NEGATIVE NEGATIVE Final    Comment: (NOTE) Fact Sheet for Patients: BloggerCourse.com  Fact Sheet for Healthcare Providers: SeriousBroker.it  This test is not yet approved or cleared by the [place]  FDA and has been authorized for detection and/or diagnosis of SARS-CoV-2 by FDA under an Emergency Use Authorization (EUA). This EUA will remain in effect (meaning this test can be used) for the duration of the COVID-19 declaration under Section 564(b)(1) of the Act, 21 U.S.C. section 360bbb-3(b)(1), unless the authorization is terminated or revoked.  Performed at [facility], [address], [place], [place] [address]   Respiratory (~20 pathogens) panel by PCR     Status: None   Collection Time: 04/19/24 11:18 AM   Specimen: Nasopharyngeal Swab; Respiratory  Result Value Ref Range Status   Adenovirus NOT DETECTED NOT DETECTED Corrected   Coronavirus 229E NOT DETECTED NOT DETECTED Corrected    Comment: (NOTE) The Coronavirus on the Respiratory Panel, DOES NOT test for the novel  Coronavirus (2019 nCoV) CORRECTED ON 08/20 AT 1940: PREVIOUSLY REPORTED AS NOT DETECTED    Coronavirus HKU1 NOT DETECTED NOT DETECTED Corrected   Coronavirus NL63 NOT DETECTED NOT DETECTED Corrected   Coronavirus OC43 NOT DETECTED NOT DETECTED Corrected   Metapneumovirus NOT  DETECTED NOT DETECTED Corrected   Rhinovirus / Enterovirus NOT DETECTED NOT DETECTED Corrected   Influenza A NOT DETECTED NOT DETECTED Corrected   Influenza B NOT DETECTED NOT DETECTED Corrected   Parainfluenza Virus 1 NOT DETECTED NOT DETECTED Corrected   Parainfluenza Virus 2 NOT DETECTED NOT DETECTED Corrected   Parainfluenza Virus 3 NOT DETECTED NOT DETECTED Corrected   Parainfluenza Virus 4 NOT DETECTED NOT DETECTED Corrected   Respiratory Syncytial Virus NOT DETECTED NOT DETECTED Corrected   Bordetella pertussis NOT DETECTED NOT DETECTED Corrected   Bordetella Parapertussis NOT DETECTED NOT DETECTED Corrected   Chlamydophila pneumoniae NOT DETECTED NOT DETECTED Corrected   Mycoplasma pneumoniae NOT DETECTED NOT DETECTED Corrected    Comment: Performed at [facility], [address], [place], [place] [address]     Labs: CBC: Recent Labs  Lab 04/16/24 1116 04/18/24 2308 04/19/24 0530 04/20/24 0417  WBC 6.0 6.2 4.9 3.8*  NEUTROABS 4.7 5.2  --   --   HGB 14.0 13.2 11.0* 11.0*  HCT 41.9 38.0 32.4* 32.6*  MCV 88.6 85.2 86.2 87.4  PLT 207 239 195 212    Basic Metabolic Panel: Recent Labs  Lab 04/16/24 1116 04/18/24 2308 04/19/24 0530 04/20/24 0417  NA 135 135 138 139  K 3.7 3.4* 3.3* 3.8  CL 103 103 106 106  CO2 22 19* 22 25  GLUCOSE 102* 116* 115* 86  BUN 14 11 10 9   CREATININE 1.14* 1.01* 0.95 0.93  CALCIUM 8.9 8.8* 8.3* 8.5*  MG  --  1.7  --  1.9    Liver Function Tests: Recent Labs  Lab 04/16/24 1116 04/18/24 2308 04/20/24 0417  AST 25 48* 44*  ALT 20 60* 49*  ALKPHOS 82 103 77  BILITOT 0.4 0.6 0.4  PROT 7.7 7.4 6.2*  ALBUMIN 3.6 3.3* 2.7*      Time coordinating discharge: 35 minutes  SIGNED:  Trenda Mar, MD,  FACP, [facility], [facility], [facility]   [facility] [place]     To contact the attending provider between 7A-7P or the covering provider during after hours 7P-7A, please log into the web site  [email or web address] and access using universal [place] password for that web site. If you do not have the password, please call the hospital operator.

## 2024-04-21 LAB — CULTURE, BLOOD (ROUTINE X 2)
Culture: NO GROWTH
Special Requests: ADEQUATE

## 2024-05-02 DIAGNOSIS — N6002 Solitary cyst of left breast: Secondary | ICD-10-CM | POA: Insufficient documentation

## 2024-05-02 DIAGNOSIS — N951 Menopausal and female climacteric states: Secondary | ICD-10-CM | POA: Insufficient documentation

## 2024-05-02 DIAGNOSIS — E663 Overweight: Secondary | ICD-10-CM | POA: Insufficient documentation

## 2024-05-02 DIAGNOSIS — I619 Nontraumatic intracerebral hemorrhage, unspecified: Secondary | ICD-10-CM | POA: Insufficient documentation

## 2024-05-02 DIAGNOSIS — E669 Obesity, unspecified: Secondary | ICD-10-CM | POA: Insufficient documentation

## 2024-05-03 ENCOUNTER — Other Ambulatory Visit (HOSPITAL_COMMUNITY): Payer: Self-pay | Admitting: Internal Medicine

## 2024-05-03 DIAGNOSIS — R9389 Abnormal findings on diagnostic imaging of other specified body structures: Secondary | ICD-10-CM

## 2024-05-05 ENCOUNTER — Other Ambulatory Visit: Payer: Self-pay | Admitting: Family

## 2024-05-05 DIAGNOSIS — N6489 Other specified disorders of breast: Secondary | ICD-10-CM

## 2024-05-05 DIAGNOSIS — N6320 Unspecified lump in the left breast, unspecified quadrant: Secondary | ICD-10-CM

## 2024-05-18 ENCOUNTER — Encounter: Payer: Self-pay | Admitting: Gastroenterology

## 2024-06-09 ENCOUNTER — Other Ambulatory Visit: Payer: Self-pay | Admitting: *Deleted

## 2024-06-09 DIAGNOSIS — Z006 Encounter for examination for normal comparison and control in clinical research program: Secondary | ICD-10-CM

## 2024-06-23 ENCOUNTER — Ambulatory Visit: Admitting: Gastroenterology

## 2024-07-18 LAB — GENECONNECT MOLECULAR SCREEN: Genetic Analysis Overall Interpretation: NEGATIVE

## 2024-07-20 ENCOUNTER — Telehealth: Payer: Self-pay

## 2024-07-20 NOTE — Telephone Encounter (Signed)
 Copied from CRM [id number]. Topic: General - Other >> Jul 12, 2024  4:24 PM Frederich PARAS wrote: Reason for CRM: pt has a new pt apt on 12/09, she would like to know if her medical records will be transferred. Her pcp retired ad she has concerns about this. Callback# is [phone]

## 2024-07-26 NOTE — Telephone Encounter (Signed)
 I have sent a medical records request to [facility] for pts old medical records. This company is who [employer] records.

## 2024-08-08 ENCOUNTER — Ambulatory Visit: Admitting: Family Medicine

## 2024-08-08 ENCOUNTER — Encounter: Payer: Self-pay | Admitting: Family Medicine

## 2024-08-08 VITALS — BP 100/64 | HR 84 | Ht 62.0 in | Wt 147.0 lb

## 2024-08-08 DIAGNOSIS — Z6826 Body mass index (BMI) 26.0-26.9, adult: Secondary | ICD-10-CM

## 2024-08-08 DIAGNOSIS — E8940 Asymptomatic postprocedural ovarian failure: Secondary | ICD-10-CM | POA: Diagnosis not present

## 2024-08-08 DIAGNOSIS — F900 Attention-deficit hyperactivity disorder, predominantly inattentive type: Secondary | ICD-10-CM

## 2024-08-08 DIAGNOSIS — E663 Overweight: Secondary | ICD-10-CM | POA: Diagnosis not present

## 2024-08-08 MED ORDER — LISDEXAMFETAMINE DIMESYLATE 20 MG PO CAPS: 20.0000 mg | ORAL_CAPSULE | Freq: Every morning | ORAL | 0 refills | Status: DC

## 2024-08-08 NOTE — Progress Notes (Signed)
" ° ° °  Patient Care Team: Prentiss Frieze, DO as PCP - General (Family Medicine) Shaaron Lamar HERO, MD as Consulting Physician (Gastroenterology)   Assessment & Plan:   1. Postsurgical menopause (Primary) The current medical regimen is effective;  continue present plan and medications.  2. 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 Will trial Vyvanse .   3. Overweight with body mass index (BMI) of 26 to 26.9 in adult  [place]reeze, DO, MS, FAAFP, Dipl. [facility] [address] [place], [address] Dept: [phone] Dept Fax: [phone]  Subjective:   Review of Systems: Negative, with the exception of above mentioned in HPI.  Medications:   Show/hide medication list[1]   Objective:   BP 100/64 (BP Location: Right Arm, Cuff Size: Normal)   Pulse 84   Ht 5' 2 (1.575 m)   Wt 147 lb (66.7 kg)   LMP 11/23/2019   SpO2 98%   BMI 26.89 kg/m   Physical Exam Constitutional:      General: She is not in acute distress.    Appearance: She is well-developed.  HENT:     Head: Normocephalic and atraumatic.  Eyes:     Conjunctiva/sclera: Conjunctivae normal.  Cardiovascular:     Rate and Rhythm: Normal rate and regular rhythm.     Heart sounds: Normal heart sounds.  Pulmonary:     Effort: Pulmonary effort is normal.     Breath sounds: Normal breath sounds.  Neurological:     General: No focal deficit present.     Mental Status: She is alert.  Psychiatric:        Behavior: Behavior normal.    Attestations:   Patient is establishing care in this system with me as PCP., Available records reviewed., Chart updated today with reconciliation of problem list, medications, allergies, and relevant history. Preventive care and chronic disease status reviewed. , and Portions of historical chart may remain incomplete; will update on an ongoing basis as clinically indicated.      [1]  Outpatient  Medications Prior to Visit  Medication Sig   Chlorpheniramine-DM (COUGH & COLD PO) Take 1 tablet by mouth 2 (two) times daily as needed (cold).   DEXILANT  60 MG capsule TAKE 1 CAPSULE BY MOUTH    DAILY BEFORE BREAKFAST   eletriptan (RELPAX) 40 MG tablet Take 40 mg by mouth every 2 (two) hours as needed for migraine or headache.   estradiol (VIVELLE-DOT) 0.05 MG/24HR patch Place 1 patch onto the skin 2 (two) times a week.   testosterone (ANDROGEL) 50 MG/5GM (1%) GEL Place 5 g onto the skin daily.   Vitamin D, Ergocalciferol, (DRISDOL) 1.25 MG (50000 UNIT) CAPS capsule Take 50,000 Units by mouth once a week.   WEGOVY 1.7 MG/0.75ML SOAJ SQ injection Inject 1.7 mg into the skin once a week.   [DISCONTINUED] DOTTI 0.075 MG/24HR Place 1 patch onto the skin 2 (two) times a week.   sucralfate  (CARAFATE ) 1 g tablet Take 1 g by mouth 4 (four) times daily as needed (acid reflux symptoms).  (Patient not taking: Reported on 08/08/2024)   [DISCONTINUED] benzonatate  (TESSALON ) 100 MG capsule Take 1 capsule (100 mg total) by mouth every 8 (eight) hours. (Patient taking differently: Take 100 mg by mouth 3 (three) times daily as needed for cough.)   No facility-administered medications prior to visit.   "

## 2024-08-09 ENCOUNTER — Telehealth: Payer: Self-pay

## 2024-08-09 ENCOUNTER — Other Ambulatory Visit (HOSPITAL_COMMUNITY): Payer: Self-pay

## 2024-08-09 NOTE — Telephone Encounter (Signed)
 Pharmacy Patient Advocate Encounter   Received notification from Pt Calls Messages that prior authorization for (VYVANSE ) 20 MG capsule  is required/requested.   Insurance verification completed.   The patient is insured through [facility].   Per test claim: PA required; PA submitted to above mentioned insurance via Latent Key/confirmation #/EOC [id number] Status is pending

## 2024-08-09 NOTE — Telephone Encounter (Signed)
 Pt calls and reports that her Vyvanse  20mg  is needing a Prior Authorization. Thank you!

## 2024-08-10 NOTE — Telephone Encounter (Signed)
 Pharmacy Patient Advocate Encounter  Received notification from [facility] that Prior Authorization for Vyvanse  20MG  capsules has been DENIED.  Full denial letter will be uploaded to the media tab once faxed over. See denial reason below.

## 2024-08-10 NOTE — Telephone Encounter (Signed)
Please see message from pt below.  Thank you

## 2024-08-11 NOTE — Telephone Encounter (Signed)
 Hello! Pt states that her insurance will cover this medication: AZSTARYS for ADHD. Dr Prentiss has requested we try a PA. Thank you Please let us  know if there are any other alternatives. Thank you

## 2024-08-15 MED ORDER — AMPHETAMINE-DEXTROAMPHET ER 10 MG PO CP24: 10.0000 mg | ORAL_CAPSULE | Freq: Every day | ORAL | 0 refills | Status: DC

## 2024-08-15 NOTE — Addendum Note (Signed)
 Addended by: PRENTISS FRIEZE R on: 08/15/2024 03:28 PM   Modules accepted: Orders

## 2024-08-16 MED ORDER — AMPHETAMINE-DEXTROAMPHET ER 10 MG PO CP24: 10.0000 mg | ORAL_CAPSULE | Freq: Every day | ORAL | 0 refills | Status: AC

## 2024-08-16 NOTE — Addendum Note (Signed)
 Addended by: PRENTISS FRIEZE R on: 08/16/2024 12:05 PM   Modules accepted: Orders

## 2024-08-16 NOTE — Telephone Encounter (Signed)
 Hi Kaylee Contreras! Dr. Prentiss asked that I reach out to you regarding this prior auth denial. In the notification below, regarding why the request for the Vyvanse  was denied, it mentions the preferred medications for Kaylee Contreras's plan. One of the preferred medications listed is Lisdexamfetamine . This being the exact medication that was prescribed and denied. Could this have been a mistake? Do you know what the next step would be to get this approved by her insurance? An Appeal? If you could like me know, I will let Dr Prentiss know. We have sent in Adderall Xr, which was listed as one of the other alternatives to see if they would be approved. Thank you!

## 2024-08-16 NOTE — Addendum Note (Signed)
 Addended by: Brian Kocourek on: 08/16/2024 12:03 PM   Modules accepted: Orders

## 2024-08-16 NOTE — Telephone Encounter (Signed)
 I spoke with pt and her Adderall was sent to the wrong pharmacy. We are not sure how or why [facility] was on her Pharmacy list. I have removed it and [facility] [place].  I will speak with Dedra today to see if we can figure out the PA issue because the screen shot we got from our PA team specifically lists the Vyvanse  as a preferred medication so not sure why it was denied. Please send in for pt. And in the mean time, if this also needs a PA, pt is okay with regular Adderall so that she at least has something to help with her focus.

## 2024-08-22 ENCOUNTER — Other Ambulatory Visit (HOSPITAL_COMMUNITY): Payer: Self-pay

## 2024-08-22 ENCOUNTER — Telehealth: Payer: Self-pay | Admitting: Pharmacy Technician

## 2024-08-22 NOTE — Telephone Encounter (Signed)
 Pharmacy Patient Advocate Encounter  Received notification from [facility] that Prior Authorization for Lisdexamfetamine  Dimesylate 20MG  capsules has been APPROVED from 08/22/2024 to 08/22/2027. Ran test claim, Copay is $10.00. This test claim was processed through [facility]- copay amounts may vary at other pharmacies due to pharmacy/plan contracts, or as the patient moves through the different stages of their insurance plan.   PA #/Case ID/Reference #: [id number]

## 2024-08-22 NOTE — Telephone Encounter (Signed)
 FYI regarding the PA approval on Vyvanse . I contacted pt and she reports that she was able to pick up the Adderall XR. She would like to switch to the Vyvanse  when she is finished with the 30 days of adderall that she picked up.

## 2024-08-30 ENCOUNTER — Encounter: Payer: Self-pay | Admitting: Family Medicine

## 2024-09-04 MED ORDER — WEGOVY 1.7 MG/0.75ML ~~LOC~~ SOAJ: 1.7000 mg | SUBCUTANEOUS | 0 refills | Status: AC

## 2024-09-05 MED ORDER — ELETRIPTAN HYDROBROMIDE 40 MG PO TABS: 40.0000 mg | ORAL_TABLET | ORAL | 2 refills | Status: AC | PRN

## 2024-09-05 MED ORDER — VITAMIN D (ERGOCALCIFEROL) 1.25 MG (50000 UNIT) PO CAPS: 50000.0000 [IU] | ORAL_CAPSULE | ORAL | 1 refills | Status: AC

## 2024-09-05 NOTE — Telephone Encounter (Signed)
 Okay to refill these 2 meds?

## 2024-09-05 NOTE — Addendum Note (Signed)
 Addended by: Francesca Strome on: 09/05/2024 07:35 AM   Modules accepted: Orders

## 2024-09-21 NOTE — Progress Notes (Unsigned)
 "    Patient Care Team: Prentiss Frieze, DO as PCP - General (Family Medicine) Shaaron Lamar HERO, MD as Consulting Physician (Gastroenterology)  Weight Management:   Starting weight: 147 lb Starting date: 08/08/24 Today's weight: *** Today's date: 09/21/2024 Total lbs lost to date: *** Total lbs lost since last in-office visit: *** Total weight loss percentage to date: -***% There is no height or weight on file to calculate BMI.   Resting Metabolic Rate: *** Nutrition Plan: {EW NUTRITION HNJOD:65522}. Anti-obesity medications (including off-label): ***. Reported side effects: ***. Hunger is {EWCONTROLASSESSMENT:24261}. Cravings are {EWCONTROLASSESSMENT:24261}. Activity: {MWM EXERCISE RECS:23473}. Sleep: Number of hours slept each night: ***. Sleep {ACTION; IS/IS NOT:21021397} restful.   Diagnoses and Orders:   No diagnosis found. No orders of the defined types were placed in this encounter.  No orders of the defined types were placed in this encounter.  Assessment & Plan:   Assessment and Plan     Frieze Prentiss, DO, MS (Nutrition), FAAFP, Dipl. ABOM Fellow, American Academy of Family Physicians Diplomate, American Board of Obesity Medicine [facility] Primary [facility] at [facility] [address] [place], [place] [address] Dept: [phone] Fax: [phone]  Subjective:     Review of Systems: Negative, with the exception of above mentioned in HPI.  History:   Reviewed by clinician on day of visit: allergies, medications, problem list, medical history, surgical history, family history, social history, and previous encounter notes.  Medications:   Show/hide medication list[1] Allergies[2]  Objective:   LMP 11/23/2019  {Insert last BP/Wt (optional):23777}{See vitals history (optional):1}   Physical Exam {Insert previous labs (optional):23779} {See past labs  Heme  Chem  Endocrine  Serology  Results Review  (optional):1}  Results for orders placed or performed in visit on 06/09/24  GeneConnect Molecular Screen   Collection Time: 07/12/24  3:26 PM  Result Value Ref Range   Genetic Analysis Overall Interpretation Negative    Genetic Disease Assessed      This is a screening test and does not detect all pathogenic or likely pathogenic variant(s) in the tested genes; diagnostic testing is recommended for individuals with a personal or family history of heart disease or hereditary cancer. [employer] Tier One  Population Screen is a screening test that analyzes 11 genes related to hereditary breast and ovarian cancer (HBOC) syndrome, Lynch syndrome, and familial hypercholesterolemia. This test only reports clinically significant pathogenic and likely  pathogenic variants but does not report variants of uncertain significance (VUS). In addition, analysis of the PMS2 gene excludes exons 11-15, which overlap with a known pseudogene (PMS2CL).    Genetic Analysis Report      No pathogenic or likely pathogenic variants were detected in the genes analyzed by this test.Genetic test results should be interpreted in the context of an individual's personal medical and family history. Alteration to medical management is NOT  recommended based solely on this result. Clinical correlation is advised.Additional Considerations- This is a screening test; individuals may still carry pathogenic or likely pathogenic variant(s) in the tested genes that are not detected by this test.-  For individuals at risk for these or other related conditions based on factors including personal or family history, diagnostic testing is recommended.- The absence of pathogenic or likely pathogenic variant(s) in the analyzed genes, while reassuring,  does not eliminate the possibility of a hereditary condition; there are other variants and genes associated with heart disease and hereditary cancer that are not included in this test.    Genes Tested  See  Notes    Disclaimer See Notes    Sequencing Location See Notes    Interpretation Methods and Limitations See Notes     09/21/2024    PHQ2-9 Depression Screening   Little interest or pleasure in doing things    Feeling down, depressed, or hopeless    PHQ-2 - Total Score    Trouble falling or staying asleep, or sleeping too much    Feeling tired or having little energy    Poor appetite or overeating     Feeling bad about yourself - or that you are a failure or have let yourself or your family down    Trouble concentrating on things, such as reading the newspaper or watching television    Moving or speaking so slowly that other people could have noticed.  Or the opposite - being so fidgety or restless that you have been moving around a lot more than usual    Thoughts that you would be better off dead, or hurting yourself in some way    PHQ2-9 Total Score    If you checked off any problems, how difficult have these problems made it for you to do your work, take care of things at home, or get along with other people    Depression Interventions/Treatment         08/08/2024    2:38 PM  GAD 7 : Generalized Anxiety Score  Nervous, Anxious, on Edge 1   Control/stop worrying 0   Worry too much - different things 0   Trouble relaxing 1   Restless 1   Easily annoyed or irritable 1   Afraid - awful might happen 0   Total GAD 7 Score 4  Anxiety Difficulty Somewhat difficult     Data saved with a previous flowsheet row definition   Attestations:   {EW ATTESTATIONS:34266}  Geni Shutter, DO, MS, FAAFP, Dipl. [facility] at [facility] [address] [place], [address] Dept: [phone] Dept Fax: [phone]    [1]  Outpatient Medications Prior to Visit  Medication Sig   amphetamine -dextroamphetamine (ADDERALL XR) 10 MG 24 hr capsule Take 1 capsule (10 mg total) by mouth daily.   Chlorpheniramine-DM (COUGH & COLD PO) Take 1 tablet by mouth 2 (two)  times daily as needed (cold).   DEXILANT  60 MG capsule TAKE 1 CAPSULE BY MOUTH    DAILY BEFORE BREAKFAST   eletriptan  (RELPAX ) 40 MG tablet Take 1 tablet (40 mg total) by mouth every 2 (two) hours as needed for migraine or headache.   estradiol (VIVELLE-DOT) 0.05 MG/24HR patch Place 1 patch onto the skin 2 (two) times a week.   testosterone (ANDROGEL) 50 MG/5GM (1%) GEL Place 5 g onto the skin daily.   Vitamin D , Ergocalciferol , (DRISDOL ) 1.25 MG (50000 UNIT) CAPS capsule Take 1 capsule (50,000 Units total) by mouth once a week.   WEGOVY  1.7 MG/0.75ML SOAJ SQ injection Inject 1.7 mg into the skin once a week.   No facility-administered medications prior to visit.  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    Prednisone Other (See Comments)    Pt not able to tolerate high doses of steroids due to only having 1 kidney, if possible. Irregular heart rate  Tape Other (See Comments)    Bruised skin, skin breakdown   "

## 2024-09-22 ENCOUNTER — Ambulatory Visit: Admitting: Family Medicine

## 2024-10-04 ENCOUNTER — Other Ambulatory Visit: Payer: Self-pay | Admitting: Family Medicine

## 2024-10-04 ENCOUNTER — Telehealth: Payer: Self-pay

## 2024-10-04 DIAGNOSIS — N6489 Other specified disorders of breast: Secondary | ICD-10-CM

## 2024-10-04 DIAGNOSIS — N6320 Unspecified lump in the left breast, unspecified quadrant: Secondary | ICD-10-CM

## 2024-10-04 NOTE — Telephone Encounter (Signed)
 We have received the urgent fax and this has been printed ans placed on dr Maryfrances desk. She is out of the office this afternoon. I have also sent and received a message from the pt and she reports that [facility] already and they told her this was already taken care of. Not sure if we want to sign and send just in case?

## 2024-10-04 NOTE — Telephone Encounter (Signed)
 Copied from CRM #[id number]. Topic: Clinical - Request for Lab/Test Order >> Oct 04, 2024 10:18 AM Robinson H wrote: Reason for CRM: Patient is scheduled for tomorrow for a follow up for diagnostic mammogram and ultrasound of left breast needs order signed, going to send over an urgent form now needs this signed today  [facility] [phone] fax Comments: Option1 then option 2

## 2024-10-06 ENCOUNTER — Inpatient Hospital Stay: Admission: RE | Admit: 2024-10-06

## 2024-10-06 ENCOUNTER — Other Ambulatory Visit

## 2024-10-06 DIAGNOSIS — N6320 Unspecified lump in the left breast, unspecified quadrant: Secondary | ICD-10-CM

## 2024-10-06 DIAGNOSIS — N6489 Other specified disorders of breast: Secondary | ICD-10-CM

## 2024-10-09 ENCOUNTER — Ambulatory Visit: Admitting: Family Medicine

## 2024-10-23 ENCOUNTER — Ambulatory Visit: Admitting: Family Medicine
